# Patient Record
Sex: Female | Born: 1990
Health system: Southern US, Community
[De-identification: ages and names within clinical notes are randomized; demographics above are authoritative.]

## PROBLEM LIST (undated history)

## (undated) DIAGNOSIS — I10 Essential (primary) hypertension: Secondary | ICD-10-CM

## (undated) DIAGNOSIS — Z8639 Personal history of other endocrine, nutritional and metabolic disease: Secondary | ICD-10-CM

## (undated) DIAGNOSIS — E039 Hypothyroidism, unspecified: Secondary | ICD-10-CM

## (undated) DIAGNOSIS — A749 Chlamydial infection, unspecified: Secondary | ICD-10-CM

## (undated) DIAGNOSIS — E559 Vitamin D deficiency, unspecified: Secondary | ICD-10-CM

## (undated) DIAGNOSIS — N939 Abnormal uterine and vaginal bleeding, unspecified: Secondary | ICD-10-CM

## (undated) DIAGNOSIS — B9689 Other specified bacterial agents as the cause of diseases classified elsewhere: Secondary | ICD-10-CM

## (undated) DIAGNOSIS — Z9189 Other specified personal risk factors, not elsewhere classified: Secondary | ICD-10-CM

## (undated) DIAGNOSIS — N760 Acute vaginitis: Secondary | ICD-10-CM

## (undated) DIAGNOSIS — G4733 Obstructive sleep apnea (adult) (pediatric): Secondary | ICD-10-CM

## (undated) HISTORY — DX: Hypothyroidism, unspecified: E03.9

## (undated) HISTORY — DX: Personal history of other endocrine, nutritional and metabolic disease: Z86.39

---
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
## 2010-09-02 DIAGNOSIS — E039 Hypothyroidism, unspecified: Secondary | ICD-10-CM

## 2010-09-02 HISTORY — DX: Hypothyroidism, unspecified: E03.9

## 2013-02-19 ENCOUNTER — Emergency Department (HOSPITAL_COMMUNITY)
Admission: EM | Admit: 2013-02-19 | Discharge: 2013-02-19 | Disposition: A | Discharge status: Home / Self Care | Payer: No Typology Code available for payment source | Attending: Emergency Medicine | Admitting: Emergency Medicine

## 2013-02-19 ENCOUNTER — Encounter (HOSPITAL_COMMUNITY): Payer: Self-pay | Admitting: *Deleted

## 2013-02-19 DIAGNOSIS — R259 Unspecified abnormal involuntary movements: Secondary | ICD-10-CM | POA: Insufficient documentation

## 2013-02-19 DIAGNOSIS — Z3202 Encounter for pregnancy test, result negative: Secondary | ICD-10-CM | POA: Diagnosis not present

## 2013-02-19 DIAGNOSIS — Z8639 Personal history of other endocrine, nutritional and metabolic disease: Secondary | ICD-10-CM | POA: Insufficient documentation

## 2013-02-19 DIAGNOSIS — R5381 Other malaise: Secondary | ICD-10-CM | POA: Insufficient documentation

## 2013-02-19 DIAGNOSIS — I10 Essential (primary) hypertension: Secondary | ICD-10-CM | POA: Insufficient documentation

## 2013-02-19 DIAGNOSIS — F17200 Nicotine dependence, unspecified, uncomplicated: Secondary | ICD-10-CM | POA: Diagnosis not present

## 2013-02-19 DIAGNOSIS — R110 Nausea: Secondary | ICD-10-CM | POA: Insufficient documentation

## 2013-02-19 DIAGNOSIS — Z862 Personal history of diseases of the blood and blood-forming organs and certain disorders involving the immune mechanism: Secondary | ICD-10-CM | POA: Insufficient documentation

## 2013-02-19 HISTORY — DX: Essential (primary) hypertension: I10

## 2013-02-19 LAB — URINALYSIS, ROUTINE W REFLEX MICROSCOPIC
Glucose, UA: NEGATIVE mg/dL
Hgb urine dipstick: NEGATIVE
Leukocytes, UA: NEGATIVE
Protein, ur: NEGATIVE mg/dL
Specific Gravity, Urine: 1.025 (ref 1.005–1.030)

## 2013-02-19 LAB — TSH: TSH: 1.902 u[IU]/mL (ref 0.350–4.500)

## 2013-02-19 LAB — MONONUCLEOSIS SCREEN: Mono Screen: NEGATIVE

## 2013-02-19 LAB — CBC WITH DIFFERENTIAL/PLATELET
Basophils Absolute: 0 10*3/uL (ref 0.0–0.1)
Basophils Relative: 0 % (ref 0–1)
HCT: 37.2 % (ref 36.0–46.0)
Hemoglobin: 12.1 g/dL (ref 12.0–15.0)
Lymphocytes Relative: 31 % (ref 12–46)
MCHC: 32.5 g/dL (ref 30.0–36.0)
Monocytes Absolute: 0.8 10*3/uL (ref 0.1–1.0)
Monocytes Relative: 9 % (ref 3–12)
Neutro Abs: 4.5 10*3/uL (ref 1.7–7.7)
Neutrophils Relative %: 55 % (ref 43–77)
RDW: 13.2 % (ref 11.5–15.5)
WBC: 8.2 10*3/uL (ref 4.0–10.5)

## 2013-02-19 LAB — POCT I-STAT, CHEM 8
BUN: 9 mg/dL (ref 6–23)
Chloride: 105 mEq/L (ref 96–112)
HCT: 40 % (ref 36.0–46.0)
Potassium: 3.9 mEq/L (ref 3.5–5.1)

## 2013-02-19 LAB — PREGNANCY, URINE: Preg Test, Ur: NEGATIVE

## 2013-02-19 NOTE — ED Provider Notes (Signed)
History     CSN: [id number]  Arrival date & time 02/19/13  1617   First MD Initiated Contact with Patient 02/19/13 1723      Chief Complaint  Patient presents with  . Weakness    (Consider location/radiation/quality/duration/timing/severity/associated sxs/prior treatment) HPI Comments: Patient is a 22 year old woman who has recently moved to [place] from [place]. She says that she has felt weakness and fatigue for several days. She had a feeling of nausea and shakiness. Therefore she sought evaluation.  Patient is a 22 y.o. female presenting with weakness. The history is provided by the patient.  Weakness This is a new problem. The current episode started more than 2 days ago. The problem occurs constantly. The problem has not changed since onset.Pertinent negatives include no chest pain, no abdominal pain, no headaches and no shortness of breath. Nothing aggravates the symptoms. Nothing relieves the symptoms. She has tried nothing (She has a history of hypertension and of hypothyroidism. She's not on medication for either condition. He denies exposure to infectious mononucleosis.) for the symptoms.    Past Medical History  Diagnosis Date  . Hypertension   . Thyroid disease     History reviewed. No pertinent past surgical history.  No family history on file.  History  Substance Use Topics  . Smoking status: Current Every Day Smoker  . Smokeless tobacco: Not on file  . Alcohol Use: Yes    OB History   Grav Para Term Preterm Abortions TAB SAB Ect Mult Living                  Review of Systems  Constitutional: Positive for fatigue. Negative for fever and chills.  HENT: Negative.   Eyes: Negative.   Respiratory: Negative.  Negative for shortness of breath.   Cardiovascular: Negative for chest pain.       She has a history of hypertension.  Gastrointestinal: Negative.  Negative for abdominal pain.  Endocrine:       She has a history of hypothyroidism.  Genitourinary:        Last menstrual period was June 2. She had a normal period.  Musculoskeletal: Negative.   Skin: Negative.   Neurological: Positive for weakness. Negative for headaches.  Psychiatric/Behavioral: Negative.     Allergies  Review of patient's allergies indicates no known allergies.  Home Medications   Current Outpatient Rx  Name  Route  Sig  Dispense  Refill  . acetaminophen (TYLENOL) 500 MG tablet   Oral   Take 1,000 mg by mouth every 6 (six) hours as needed for pain.           BP 119/73  Pulse 95  Temp(Src) 99.9 F (37.7 C) (Oral)  Resp 20  SpO2 100%  LMP 01/31/2013  Physical Exam  Nursing note and vitals reviewed. Constitutional: She is oriented to person, place, and time.  Morbidly obese young woman in no distress.  HENT:  Head: Normocephalic and atraumatic.  Right Ear: External ear normal.  Left Ear: External ear normal.  Mouth/Throat: Oropharynx is clear and moist.  Eyes: Conjunctivae and EOM are normal. Pupils are equal, round, and reactive to light.  Neck: Normal range of motion. Neck supple.  No thyromegaly or mass.  Cardiovascular: Normal rate, regular rhythm and normal heart sounds.   Pulmonary/Chest: Effort normal and breath sounds normal. No respiratory distress.  Abdominal: Soft. Bowel sounds are normal.  Musculoskeletal: Normal range of motion. She exhibits no edema and no tenderness.  Neurological: She is alert  and oriented to person, place, and time.  No sensory or motor deficit.  Skin: Skin is warm and dry.  Psychiatric: She has a normal mood and affect. Her behavior is normal.    ED Course  Procedures (including critical care time)  Results for orders placed during the hospital encounter of 02/19/13  URINALYSIS, ROUTINE W REFLEX MICROSCOPIC      Result Value Range   Color, Urine YELLOW  YELLOW   APPearance CLEAR  CLEAR   Specific Gravity, Urine 1.025  1.005 - 1.030   pH 8.5 (*) 5.0 - 8.0   Glucose, UA NEGATIVE  NEGATIVE mg/dL   Hgb  urine dipstick NEGATIVE  NEGATIVE   Bilirubin Urine NEGATIVE  NEGATIVE   Ketones, ur NEGATIVE  NEGATIVE mg/dL   Protein, ur NEGATIVE  NEGATIVE mg/dL   Urobilinogen, UA 1.0  0.0 - 1.0 mg/dL   Nitrite NEGATIVE  NEGATIVE   Leukocytes, UA NEGATIVE  NEGATIVE  PREGNANCY, URINE      Result Value Range   Preg Test, Ur NEGATIVE  NEGATIVE  CBC WITH DIFFERENTIAL      Result Value Range   WBC 8.2  4.0 - 10.5 K/uL   RBC 4.65  3.87 - 5.11 MIL/uL   Hemoglobin 12.1  12.0 - 15.0 g/dL   HCT 16.1  09.6 - 04.5 %   MCV 80.0  78.0 - 100.0 fL   MCH 26.0  26.0 - 34.0 pg   MCHC 32.5  30.0 - 36.0 g/dL   RDW 40.9  81.1 - 91.4 %   Platelets 248  150 - 400 K/uL   Neutrophils Relative % 55  43 - 77 %   Neutro Abs 4.5  1.7 - 7.7 K/uL   Lymphocytes Relative 31  12 - 46 %   Lymphs Abs 2.6  0.7 - 4.0 K/uL   Monocytes Relative 9  3 - 12 %   Monocytes Absolute 0.8  0.1 - 1.0 K/uL   Eosinophils Relative 4  0 - 5 %   Eosinophils Absolute 0.4  0.0 - 0.7 K/uL   Basophils Relative 0  0 - 1 %   Basophils Absolute 0.0  0.0 - 0.1 K/uL  POCT I-STAT, CHEM 8      Result Value Range   Sodium 141  135 - 145 mEq/L   Potassium 3.9  3.5 - 5.1 mEq/L   Chloride 105  96 - 112 mEq/L   BUN 9  6 - 23 mg/dL   Creatinine, Ser 7.82  0.50 - 1.10 mg/dL   Glucose, Bld 89  70 - 99 mg/dL   Calcium, Ion 9.56  2.13 - 1.23 mmol/L   TCO2 25  0 - 100 mmol/L   Hemoglobin 13.6  12.0 - 15.0 g/dL   HCT 08.6  57.8 - 46.9 %    Patient was seen and had physical examination. Her lab tests, consisting of a CBC, basic metabolic panel, urinalysis, urine pregnancy test, were all negative. A TSH and a mono screening tests were ordered, but she will not wait for the results of these tests. I referred her to [facility] for her to establish herself as the patient.      1. Malaise and fatigue          Carleene Cooper III, MD 02/19/13 1757

## 2013-02-19 NOTE — ED Notes (Signed)
MD Davidson at bedside.

## 2013-02-19 NOTE — ED Notes (Signed)
The pt is c/o being tired and weak for 4 days.  Alert no distess

## 2013-04-15 ENCOUNTER — Emergency Department (HOSPITAL_COMMUNITY): Payer: No Typology Code available for payment source

## 2013-04-15 ENCOUNTER — Emergency Department (HOSPITAL_COMMUNITY)
Admission: EM | Admit: 2013-04-15 | Discharge: 2013-04-15 | Discharge status: Against Medical Advice | Payer: No Typology Code available for payment source | Attending: Emergency Medicine | Admitting: Emergency Medicine

## 2013-04-15 ENCOUNTER — Encounter (HOSPITAL_COMMUNITY): Payer: Self-pay | Admitting: Emergency Medicine

## 2013-04-15 ENCOUNTER — Emergency Department (HOSPITAL_COMMUNITY)
Admission: EM | Admit: 2013-04-15 | Discharge: 2013-04-15 | Disposition: A | Discharge status: Home / Self Care | Payer: No Typology Code available for payment source | Attending: Emergency Medicine | Admitting: Emergency Medicine

## 2013-04-15 DIAGNOSIS — R059 Cough, unspecified: Secondary | ICD-10-CM | POA: Insufficient documentation

## 2013-04-15 DIAGNOSIS — J329 Chronic sinusitis, unspecified: Secondary | ICD-10-CM | POA: Insufficient documentation

## 2013-04-15 DIAGNOSIS — R05 Cough: Secondary | ICD-10-CM | POA: Insufficient documentation

## 2013-04-15 DIAGNOSIS — R5381 Other malaise: Secondary | ICD-10-CM | POA: Insufficient documentation

## 2013-04-15 DIAGNOSIS — I10 Essential (primary) hypertension: Secondary | ICD-10-CM | POA: Insufficient documentation

## 2013-04-15 DIAGNOSIS — J3489 Other specified disorders of nose and nasal sinuses: Secondary | ICD-10-CM | POA: Insufficient documentation

## 2013-04-15 DIAGNOSIS — R5383 Other fatigue: Secondary | ICD-10-CM | POA: Insufficient documentation

## 2013-04-15 DIAGNOSIS — Z862 Personal history of diseases of the blood and blood-forming organs and certain disorders involving the immune mechanism: Secondary | ICD-10-CM | POA: Insufficient documentation

## 2013-04-15 DIAGNOSIS — J029 Acute pharyngitis, unspecified: Secondary | ICD-10-CM | POA: Insufficient documentation

## 2013-04-15 DIAGNOSIS — F17200 Nicotine dependence, unspecified, uncomplicated: Secondary | ICD-10-CM | POA: Insufficient documentation

## 2013-04-15 DIAGNOSIS — Z8639 Personal history of other endocrine, nutritional and metabolic disease: Secondary | ICD-10-CM | POA: Insufficient documentation

## 2013-04-15 DIAGNOSIS — R51 Headache: Secondary | ICD-10-CM | POA: Insufficient documentation

## 2013-04-15 LAB — GLUCOSE, CAPILLARY: Glucose-Capillary: 101 mg/dL — ABNORMAL HIGH (ref 70–99)

## 2013-04-15 NOTE — ED Notes (Signed)
Per pt, mother was cleaning with bleach on Monday now pt has bad cough-green mucous

## 2013-04-15 NOTE — ED Provider Notes (Signed)
CSN: [id number]     Arrival date & time 04/15/13  1830 History    This chart was scribed for a non-physician practitioner, Earley Favor, NP, working with Junius Argyle, MD by Frederik Pear, ED Scribe. This patient was seen in room WTR7/WTR7 and the patient's care was started at 2004.  First MD Initiated Contact with Patient 04/15/13 2004     Chief Complaint  Patient presents with  . Cough   (Consider location/radiation/quality/duration/timing/severity/associated sxs/prior Treatment) The history is provided by the patient and a parent. No language interpreter was used.    HPI Comments: Felicia Bailey is a 22 y.o. female who presents to the Emergency Department complaining of a productive cough with green sputum with associated rhinorrhea that began 4 days ago after cleaning with bleach. She is afebrile. LMP was 1 week, which was normal.   Pt's mother is concerned with ongoing fatigue. She was seen in the ED on 06/20 and a CBC, BMP, urinalysis, urine pregnancy test, TSH, and mono screen, which were all negative. She reports a family h/o of Hashimoto's disease and hypertension. She is also concerned with ongoing sharp pains that last for 30 seconds at a time throughout the pt's entire head with intermittent nausea that has been ongoing for several years. The pt reports she was taking propanolol, but discontinued the medication a year ago because she didn't feel as if it was helping because she does not believe the HAs are related to her hypertension. Her mother is requesting a head CT.   Her mother reports the pt recently relocated to [facility] from [place] and is not yet established with a PCP.  Past Medical History  Diagnosis Date  . Hypertension   . Thyroid disease    History reviewed. No pertinent past surgical history. No family history on file. History  Substance Use Topics  . Smoking status: Current Every Day Smoker  . Smokeless tobacco: Not on file  . Alcohol Use: Yes   OB  History   Grav Para Term Preterm Abortions TAB SAB Ect Mult Living                 Review of Systems  Constitutional: Positive for fatigue.  HENT: Positive for sore throat and rhinorrhea.   Respiratory: Positive for cough. Negative for shortness of breath.   Gastrointestinal: Negative for nausea.  Genitourinary: Negative for dysuria.  Musculoskeletal: Negative for back pain.  Skin: Negative for rash.  Neurological: Positive for headaches. Negative for dizziness, tremors, seizures, speech difficulty, weakness and numbness.  All other systems reviewed and are negative.   Allergies  Review of patient's allergies indicates no known allergies.  Home Medications   Current Outpatient Rx  Name  Route  Sig  Dispense  Refill  . acetaminophen (TYLENOL) 500 MG tablet   Oral   Take 1,000 mg by mouth every 6 (six) hours as needed for pain.          BP 138/90  Pulse 110  Temp(Src) 98.8 F (37.1 C) (Oral)  Resp 16  SpO2 96%  LMP 04/08/2013 Physical Exam  Nursing note and vitals reviewed. Constitutional: She is oriented to person, place, and time. She appears well-developed and well-nourished.  Morbidly obese  HENT:  Left Ear: External ear normal.  Nose: Nose normal.  Mouth/Throat: Oropharynx is clear and moist. No oropharyngeal exudate, posterior oropharyngeal edema, posterior oropharyngeal erythema or tonsillar abscesses.  Eyes: Pupils are equal, round, and reactive to light.  Neck: Normal range of motion. Neck  supple.  Cardiovascular: Normal rate and regular rhythm.  Exam reveals no gallop and no friction rub.   No murmur heard. Pulmonary/Chest: Breath sounds normal. No respiratory distress. She has no wheezes. She has no rales. She exhibits no tenderness.  Advantageous breath sounds.  Musculoskeletal: Normal range of motion. She exhibits no tenderness.  Lymphadenopathy:    She has no cervical adenopathy.  Neurological: She is alert and oriented to person, place, and time.   Skin: Skin is warm and dry. No rash noted. She is not diaphoretic. No erythema.  Psychiatric: She has a normal mood and affect. Her behavior is normal. Judgment and thought content normal.   ED Course   Procedures (including critical care time)  DIAGNOSTIC STUDIES: Oxygen Saturation is 96% on room air, adequate by my interpretation.    COORDINATION OF CARE:  20:15- Discussed planned course of treatment with the patient, including a chest X-ray and capillary glucose, who is agreeable at this time.  20:42 Head CT without contrast independently read by radiologist and independently reviewed by Earley Favor, NP.   Labs Reviewed  GLUCOSE, CAPILLARY - Abnormal; Notable for the following:    Glucose-Capillary 101 (*)    All other components within normal limits   Dg Chest 2 View  04/15/2013   *RADIOLOGY REPORT*  Clinical Data: Shortness of breath and cough  CHEST - 2 VIEW  Comparison: None.  Findings: Cardiomediastinal silhouette is within normal limits. The lungs are clear. No pleural effusion.  No pneumothorax.  No acute osseous abnormality.  IMPRESSION: Normal chest.   Original Report Authenticated By: Christiana Pellant, M.D.   Ct Head Wo Contrast  04/15/2013   *RADIOLOGY REPORT*  Clinical Data: Headache  CT HEAD WITHOUT CONTRAST  Technique:  Contiguous axial images were obtained from the base of the skull through the vertex without contrast.  Comparison: None.  Findings: No acute hemorrhage, acute infarction, or mass lesion is identified.  No midline shift.  No ventriculomegaly.  No skull fracture.  Dense consolidation of the right maxillary and bilateral ethmoid sinuses is noted with diffuse sinus mucoperiosteal thickening.  No skull fracture.  IMPRESSION: No acute intracranial finding.  Pansinusitis.   Original Report Authenticated By: Christiana Pellant, M.D.   No diagnosis found.  MDM  Only under duress is a chest xray and head ct being preformed as I do not think the patient needs either   Patient informed for xray and CT results  I personally performed the services described in this documentation, which was scribed in my presence. The recorded information has been reviewed and is accurate.   Arman Filter, NP 04/15/13 2116

## 2013-04-15 NOTE — ED Notes (Signed)
Pt ambulatory to exam room with steady gait. Pt with no acute distress.  

## 2013-04-15 NOTE — ED Notes (Signed)
Patient transported to X-ray 

## 2013-04-16 NOTE — ED Provider Notes (Signed)
Medical screening examination/treatment/procedure(s) were performed by non-physician practitioner and as supervising physician I was immediately available for consultation/collaboration.   Brelyn Woehl S Omayra Tulloch, MD 04/16/13 1222 

## 2013-06-26 ENCOUNTER — Encounter (HOSPITAL_COMMUNITY): Payer: Self-pay | Admitting: Emergency Medicine

## 2013-06-26 ENCOUNTER — Emergency Department (HOSPITAL_COMMUNITY)
Admission: EM | Admit: 2013-06-26 | Discharge: 2013-06-26 | Disposition: A | Discharge status: Home / Self Care | Payer: No Typology Code available for payment source | Attending: Emergency Medicine | Admitting: Emergency Medicine

## 2013-06-26 DIAGNOSIS — R229 Localized swelling, mass and lump, unspecified: Secondary | ICD-10-CM | POA: Insufficient documentation

## 2013-06-26 DIAGNOSIS — L988 Other specified disorders of the skin and subcutaneous tissue: Secondary | ICD-10-CM | POA: Insufficient documentation

## 2013-06-26 DIAGNOSIS — I10 Essential (primary) hypertension: Secondary | ICD-10-CM | POA: Insufficient documentation

## 2013-06-26 DIAGNOSIS — R21 Rash and other nonspecific skin eruption: Secondary | ICD-10-CM | POA: Insufficient documentation

## 2013-06-26 DIAGNOSIS — E039 Hypothyroidism, unspecified: Secondary | ICD-10-CM | POA: Insufficient documentation

## 2013-06-26 DIAGNOSIS — Z87891 Personal history of nicotine dependence: Secondary | ICD-10-CM | POA: Insufficient documentation

## 2013-06-26 DIAGNOSIS — L989 Disorder of the skin and subcutaneous tissue, unspecified: Secondary | ICD-10-CM

## 2013-06-26 DIAGNOSIS — E669 Obesity, unspecified: Secondary | ICD-10-CM | POA: Insufficient documentation

## 2013-06-26 LAB — URIC ACID: Uric Acid, Serum: 4.6 mg/dL (ref 2.4–7.0)

## 2013-06-26 LAB — CBC WITH DIFFERENTIAL/PLATELET
Basophils Absolute: 0 10*3/uL (ref 0.0–0.1)
Basophils Relative: 0 % (ref 0–1)
Eosinophils Absolute: 0.3 10*3/uL (ref 0.0–0.7)
Lymphocytes Relative: 32 % (ref 12–46)
MCH: 25.8 pg — ABNORMAL LOW (ref 26.0–34.0)
MCHC: 32.6 g/dL (ref 30.0–36.0)
Monocytes Absolute: 0.7 10*3/uL (ref 0.1–1.0)
Neutrophils Relative %: 56 % (ref 43–77)
Platelets: 246 10*3/uL (ref 150–400)
RDW: 13.3 % (ref 11.5–15.5)

## 2013-06-26 LAB — POCT I-STAT, CHEM 8
BUN: 4 mg/dL — ABNORMAL LOW (ref 6–23)
Calcium, Ion: 1.13 mmol/L (ref 1.12–1.23)
Hemoglobin: 12.9 g/dL (ref 12.0–15.0)
TCO2: 23 mmol/L (ref 0–100)

## 2013-06-26 LAB — C-REACTIVE PROTEIN: CRP: <0.5 mg/dL — ABNORMAL LOW (ref <0.60)

## 2013-06-26 LAB — SEDIMENTATION RATE: Sed Rate: 12 mm/hr (ref 0–22)

## 2013-06-26 MED ORDER — METHYLPREDNISOLONE (PAK) 4 MG PO TABS: ORAL_TABLET | ORAL | Status: DC

## 2013-06-26 MED ORDER — PREDNISONE 20 MG PO TABS
60.0000 mg | ORAL_TABLET | Freq: Once | ORAL | Status: AC
  Administered 2013-06-26: 60 mg via ORAL
  Filled 2013-06-26: qty 3

## 2013-06-26 MED ORDER — HYDROCODONE-ACETAMINOPHEN 5-325 MG PO TABS: 1.0000 | ORAL_TABLET | ORAL | Status: DC | PRN

## 2013-06-26 MED ORDER — HYDROCODONE-ACETAMINOPHEN 5-325 MG PO TABS
1.0000 | ORAL_TABLET | Freq: Once | ORAL | Status: AC
  Administered 2013-06-26: 1 via ORAL
  Filled 2013-06-26: qty 1

## 2013-06-26 NOTE — ED Provider Notes (Signed)
CSN: [id number]     Arrival date & time 06/26/13  0356 History   First MD Initiated Contact with Patient 06/26/13 0411     Chief Complaint  Patient presents with  . Foot Pain   HPI  History provided by the patient. Patient is a 22 year old female with past history of hypertension and thyroid disease who presents with complaints of recurrent painful nodules primarily to her lower extremities. Patient states that for the past several months since July she has had frequent episodes of painful swollen nodules under her skin of her feet and lower legs. Lesions will occur bilaterally and generally resolve after 24-48 hours with rest and sleep. She has occasionally had similar red nodules of the skin to her hands and forearm areas. Patient usually takes Tylenol or ibuprofen for the symptoms and states this helps somewhat with the pain and discomfort of the lesions in the legs. She denies any recent illnesses. Denies fever, chills or sweats. She denies any respiratory complaints. No shortness of breath or chest pains. She denies any urinary changes. No vaginal bleeding or vaginal discharge. No prior history of STDs. No other aggravating or alleviating factors. No other associated symptoms.   Past Medical History  Diagnosis Date  . Hypertension   . Thyroid disease    History reviewed. No pertinent past surgical history. History reviewed. No pertinent family history. History  Substance Use Topics  . Smoking status: Former Games developer  . Smokeless tobacco: Not on file  . Alcohol Use: 1.2 oz/week    2 Cans of beer per week   OB History   Grav Para Term Preterm Abortions TAB SAB Ect Mult Living   1 1 1             Review of Systems  Constitutional: Negative for fever, chills, diaphoresis and unexpected weight change.  Respiratory: Negative for cough and shortness of breath.   Cardiovascular: Negative for chest pain.  Gastrointestinal: Negative for nausea, vomiting, abdominal pain, diarrhea and  constipation.  Genitourinary: Negative for dysuria, frequency, hematuria, flank pain, vaginal bleeding, vaginal discharge, genital sores and menstrual problem.  Skin: Positive for rash.       Painful nodules  All other systems reviewed and are negative.    Allergies  Review of patient's allergies indicates no known allergies.  Home Medications   Current Outpatient Rx  Name  Route  Sig  Dispense  Refill  . acetaminophen (TYLENOL) 500 MG tablet   Oral   Take 1,000 mg by mouth every 6 (six) hours as needed for pain.          BP 160/96  Pulse 108  Temp(Src) 98.2 F (36.8 C) (Oral)  Resp 18  SpO2 96%  LMP 06/03/2013 Physical Exam  Nursing note and vitals reviewed. Constitutional: She is oriented to person, place, and time. She appears well-developed and well-nourished. No distress.  Obese  HENT:  Head: Normocephalic.  Eyes: Conjunctivae are normal.  Cardiovascular: Normal rate and regular rhythm.   Pulmonary/Chest: Effort normal and breath sounds normal. No respiratory distress. She has no wheezes. She has no rales.  Abdominal: Soft. There is no tenderness. There is no rebound and no guarding.  Neurological: She is alert and oriented to person, place, and time.  Skin: Skin is warm and dry.  2 erythematous and tender nodules to the bilateral plantar and medial surface to the arch of the feet. Similar smaller erythematous nodule to the dorsal right foot.  Psychiatric: She has a normal mood and  affect. Her behavior is normal.    ED Course  Procedures   Results for orders placed during the hospital encounter of 06/26/13  CBC WITH DIFFERENTIAL      Result Value Range   WBC 8.4  4.0 - 10.5 K/uL   RBC 4.80  3.87 - 5.11 MIL/uL   Hemoglobin 12.4  12.0 - 15.0 g/dL   HCT 81.1  91.4 - 78.2 %   MCV 79.2  78.0 - 100.0 fL   MCH 25.8 (*) 26.0 - 34.0 pg   MCHC 32.6  30.0 - 36.0 g/dL   RDW 95.6  21.3 - 08.6 %   Platelets 246  150 - 400 K/uL   Neutrophils Relative % 56  43 - 77 %    Lymphocytes Relative 32  12 - 46 %   Monocytes Relative 8  3 - 12 %   Eosinophils Relative 4  0 - 5 %   Basophils Relative 0  0 - 1 %   Neutro Abs 4.7  1.7 - 7.7 K/uL   Lymphs Abs 2.7  0.7 - 4.0 K/uL   Monocytes Absolute 0.7  0.1 - 1.0 K/uL   Eosinophils Absolute 0.3  0.0 - 0.7 K/uL   Basophils Absolute 0.0  0.0 - 0.1 K/uL  URIC ACID      Result Value Range   Uric Acid, Serum 4.6  2.4 - 7.0 mg/dL  POCT I-STAT, CHEM 8      Result Value Range   Sodium 139  135 - 145 mEq/L   Potassium 3.6  3.5 - 5.1 mEq/L   Chloride 110  96 - 112 mEq/L   BUN 4 (*) 6 - 23 mg/dL   Creatinine, Ser 5.78  0.50 - 1.10 mg/dL   Glucose, Bld 85  70 - 99 mg/dL   Calcium, Ion 4.69  6.29 - 1.23 mmol/L   TCO2 23  0 - 100 mmol/L   Hemoglobin 12.9  12.0 - 15.0 g/dL   HCT 52.8  41.3 - 24.4 %          MDM   1. Skin lesions, generalized   2. Hypertension     Patient is new to the area. She has been seen previously in the emergency department. Has known history of hypothyroidism. TSH levels were normal on previous visit.  Patient with episodic painful erythematous nodules primarily to the feet and lower extremities. Occasionally she has less tender nodules to the upper extremities. Denies any shortness of breath or respiratory symptoms. Her symptoms usually lasts 24-48 hours. They have been ongoing for the past 3-4 months.  Differential diagnosis include possible infection however patient afebrile with no elevated WBC, Arthritis (possibly rheumatological), gout, dermatofibroma (however she reports lesion completely resolve and return) and erythema nodosum.    Angus Seller, PA-C 06/26/13 [phone]

## 2013-06-26 NOTE — ED Provider Notes (Signed)
Medical screening examination/treatment/procedure(s) were performed by non-physician practitioner and as supervising physician I was immediately available for consultation/collaboration.    Sunnie Nielsen, MD 06/26/13 [phone]

## 2013-06-26 NOTE — ED Notes (Addendum)
Pt states that since July she has had random inflammation throughout her body, she states that she gets lumps wherever the inflammation is occuring. Pt states that she gets these lumps on the bottom of her feet and cannot walk. The only lumps that are painful are in her feet. Pt states that she gets these on her bilat feet, hands, inside of her elbows, behind her knees, calves, forearms.

## 2014-05-16 ENCOUNTER — Encounter (HOSPITAL_COMMUNITY): Payer: Self-pay | Admitting: Emergency Medicine

## 2014-05-16 DIAGNOSIS — M25476 Effusion, unspecified foot: Principal | ICD-10-CM | POA: Insufficient documentation

## 2014-05-16 DIAGNOSIS — Z8639 Personal history of other endocrine, nutritional and metabolic disease: Secondary | ICD-10-CM | POA: Insufficient documentation

## 2014-05-16 DIAGNOSIS — M25473 Effusion, unspecified ankle: Secondary | ICD-10-CM | POA: Insufficient documentation

## 2014-05-16 DIAGNOSIS — Z862 Personal history of diseases of the blood and blood-forming organs and certain disorders involving the immune mechanism: Secondary | ICD-10-CM | POA: Insufficient documentation

## 2014-05-16 DIAGNOSIS — Z87891 Personal history of nicotine dependence: Secondary | ICD-10-CM | POA: Insufficient documentation

## 2014-05-16 DIAGNOSIS — I10 Essential (primary) hypertension: Secondary | ICD-10-CM | POA: Insufficient documentation

## 2014-05-16 DIAGNOSIS — M2540 Effusion, unspecified joint: Secondary | ICD-10-CM | POA: Insufficient documentation

## 2014-05-16 DIAGNOSIS — Z791 Long term (current) use of non-steroidal anti-inflammatories (NSAID): Secondary | ICD-10-CM | POA: Insufficient documentation

## 2014-05-16 NOTE — ED Notes (Signed)
She has chronic swollen rt hand  Lt foot with redness and swelling and it moves to   othert hands and feet intermittently.  .  It is very painful.  lmp  913

## 2014-05-17 ENCOUNTER — Emergency Department (HOSPITAL_COMMUNITY)
Admission: EM | Admit: 2014-05-17 | Discharge: 2014-05-17 | Disposition: A | Discharge status: Home / Self Care | Payer: No Typology Code available for payment source | Attending: Emergency Medicine | Admitting: Emergency Medicine

## 2014-05-17 DIAGNOSIS — M2540 Effusion, unspecified joint: Secondary | ICD-10-CM

## 2014-05-17 MED ORDER — PREDNISONE 20 MG PO TABS: 60.0000 mg | ORAL_TABLET | Freq: Every day | ORAL | Status: AC

## 2014-05-17 MED ORDER — PREDNISONE 20 MG PO TABS
60.0000 mg | ORAL_TABLET | Freq: Once | ORAL | Status: AC
  Administered 2014-05-17: 60 mg via ORAL
  Filled 2014-05-17: qty 3

## 2014-05-17 MED ORDER — IBUPROFEN 800 MG PO TABS
800.0000 mg | ORAL_TABLET | Freq: Once | ORAL | Status: AC
  Administered 2014-05-17: 800 mg via ORAL
  Filled 2014-05-17: qty 1

## 2014-05-17 NOTE — ED Provider Notes (Signed)
CSN: [id number]     Arrival date & time 05/16/14  2311 History   First MD Initiated Contact with Patient 05/17/14 0123     Chief Complaint  Patient presents with  . Joint Swelling     (Consider location/radiation/quality/duration/timing/severity/associated sxs/prior Treatment) HPI Felicia Bailey is a 23 yo female with no sig pmh presenting with swelling in her feet.  She states this has occurred off and on for the past but the patient does not have a PCP to follow up with.  This is normally treated with naproxen, or she goes to the ED for treatment.  She denies an allergic ideology to her symptoms and has been told she may have gout.  At times the swelling and pain can be in her toes or hands, never anywhere else.  She denies any other systemic symptoms like fever, infections, CP, SOB, AP, or changes in her bowel or bladder.  10 Systems reviewed and are negative for acute change except as noted in the HPI.     Past Medical History  Diagnosis Date  . Hypertension   . Thyroid disease    History reviewed. No pertinent past surgical history. No family history on file. History  Substance Use Topics  . Smoking status: Former Games developer  . Smokeless tobacco: Not on file  . Alcohol Use: 1.2 oz/week    2 Cans of beer per week   OB History   Grav Para Term Preterm Abortions TAB SAB Ect Mult Living   Review of Systems    Allergies  Review of patient's allergies indicates no known allergies.  Home Medications   Prior to Admission medications   Medication Sig Start Date End Date Taking? Authorizing Provider  acetaminophen (TYLENOL) 500 MG tablet Take 1,000 mg by mouth every 6 (six) hours as needed for pain.   Yes Historical Provider, MD  ibuprofen (ADVIL,MOTRIN) 200 MG tablet Take 200 mg by mouth every 6 (six) hours as needed for pain.   Yes Historical Provider, MD  naproxen (NAPROSYN) 250 MG tablet Take 500 mg by mouth daily as needed for mild pain.   Yes Historical  Provider, MD  predniSONE (DELTASONE) 20 MG tablet Take 3 tablets (60 mg total) by mouth daily. 05/17/14 05/20/14  Tomasita Crumble, MD   BP 130/90  Pulse 80  Temp(Src) 98.7 F (37.1 C) (Oral)  Resp 18  Ht  (1.575 m)  Wt 250 lb (113.399 kg)  BMI 45.71 kg/m2  SpO2 99%  LMP 05/15/2014 Physical Exam  Nursing note and vitals reviewed. Constitutional: She is oriented to person, place, and time. She appears well-developed and well-nourished. No distress.  HENT:  Head: Normocephalic and atraumatic.  Nose: Nose normal.  Mouth/Throat: Oropharynx is clear and moist. No oropharyngeal exudate.  Eyes: Conjunctivae and EOM are normal. Pupils are equal, round, and reactive to light. No scleral icterus.  Neck: Normal range of motion. Neck supple. No JVD present. No tracheal deviation present. No thyromegaly present.  Cardiovascular: Normal rate, regular rhythm and normal heart sounds.  Exam reveals no gallop and no friction rub.   No murmur heard. Pulmonary/Chest: Effort normal and breath sounds normal. No respiratory distress. She has no wheezes. She exhibits no tenderness.  Abdominal: Soft. Bowel sounds are normal. She exhibits no distension and no mass. There is no tenderness. There is no rebound and no guarding.  Musculoskeletal: Normal range of motion. She exhibits no edema and  no tenderness.  Mildly swollen plantar surface of bilateral feet.  No warmth or drainage or erythema.   Lymphadenopathy:    She has no cervical adenopathy.  Neurological: She is alert and oriented to person, place, and time.  Skin: Skin is warm and dry. No rash noted. She is not diaphoretic. No erythema. No pallor.    ED Course  Procedures (including critical care time) Labs Review Labs Reviewed - No data to display  Imaging Review No results found.   EKG Interpretation None      MDM   Final diagnoses:  Joint swelling   Patient appears nontoxic and in no acute distress.  The swelling and pain in her feet  is annoying her. She normally gets an NSAID and steroid for pain relief and I gave her naproxen and prednisone.  On my repeat assessment she states her pain has improved.  I put her on a 5 day steroid course for continued treatment and she is in the process of getting a PCP in the area.  She was told she will need a joint tap for diagnosis of gout.  Return precautions given, vitals remain normal, safe for DC     Tomasita Crumble, MD 05/17/14 2122

## 2014-05-17 NOTE — Discharge Instructions (Signed)
Arthralgia Ms. Whiting, you were seen today for joint pain.  Continue to take prednisone as prescribed and motrin as needed for pain relief.  Follow up with a primary care doctor within 3 days for continued care.  Return for any worsening.  Thank you. Arthralgia is joint pain. A joint is a place where two bones meet. Joint pain can happen for many reasons. The joint can be bruised, stiff, infected, or weak from aging. Pain usually goes away after resting and taking medicine for soreness.  HOME CARE  Rest the joint as told by your doctor.  Keep the sore joint raised (elevated) for the first 24 hours.  Put ice on the joint area.  Put ice in a plastic bag.  Place a towel between your skin and the bag.  Leave the ice on for 15-20 minutes, 03-04 times a day.  Wear your splint, casting, elastic bandage, or sling as told by your doctor.  Only take medicine as told by your doctor. Do not take aspirin.  Use crutches as told by your doctor. Do not put weight on the joint until told to by your doctor. GET HELP RIGHT AWAY IF:   You have bruising, puffiness (swelling), or more pain.  Your fingers or toes turn blue or start to lose feeling (numb).  Your medicine does not lessen the pain.  Your pain becomes severe.  You have a temperature by mouth above 102 F (38.9 C), not controlled by medicine.  You cannot move or use the joint. MAKE SURE YOU:   Understand these instructions.  Will watch your condition.  Will get help right away if you are not doing well or get worse. Document Released: 08/07/2009 Document Revised: 11/11/2011 Document Reviewed: 08/07/2009 [facility] Patient Information 2015 [place], [place]. This information is not intended to replace advice given to you by your health care provider. Make sure you discuss any questions you have with your health care provider.

## 2014-05-17 NOTE — ED Notes (Signed)
Pt. Reports "flare ups" of joint swelling. States it is typically her feet and hands, states normally lasts approx 8 hours this episode has been almost a week. Reports severe pain with it.

## 2014-06-10 ENCOUNTER — Encounter: Payer: Self-pay | Admitting: Family Medicine

## 2014-06-10 ENCOUNTER — Ambulatory Visit: Payer: No Typology Code available for payment source | Attending: Family Medicine | Admitting: Family Medicine

## 2014-06-10 VITALS — BP 135/85 | HR 100 | Temp 99.0°F | Resp 18 | Ht 62.0 in | Wt 288.0 lb

## 2014-06-10 DIAGNOSIS — R600 Localized edema: Secondary | ICD-10-CM | POA: Insufficient documentation

## 2014-06-10 DIAGNOSIS — E039 Hypothyroidism, unspecified: Secondary | ICD-10-CM

## 2014-06-10 DIAGNOSIS — M7989 Other specified soft tissue disorders: Secondary | ICD-10-CM | POA: Insufficient documentation

## 2014-06-10 DIAGNOSIS — R609 Edema, unspecified: Secondary | ICD-10-CM

## 2014-06-10 DIAGNOSIS — Z8639 Personal history of other endocrine, nutritional and metabolic disease: Secondary | ICD-10-CM | POA: Insufficient documentation

## 2014-06-10 LAB — POCT URINALYSIS DIPSTICK
Bilirubin, UA: NEGATIVE
Blood, UA: NEGATIVE
GLUCOSE UA: NEGATIVE
KETONES UA: NEGATIVE
LEUKOCYTES UA: NEGATIVE
Nitrite, UA: NEGATIVE
Protein, UA: NEGATIVE
SPEC GRAV UA: 1.025
UROBILINOGEN UA: 0.2
pH, UA: 5.5

## 2014-06-10 NOTE — Assessment & Plan Note (Signed)
tsh today

## 2014-06-10 NOTE — Progress Notes (Signed)
   Subjective:    Patient ID: Felicia Bailey, female    DOB: 09/10/1990, 23 y.o.   MRN: [id number] CC: health concerns, random inflammation  HPI CC: foot swelling  1. Foot swelling: first noticed in July 2014. Bilateral foot swelling. With pain. Diffuse. Sometimes with itchy and rash. Rash was red macules. No medications at the onset. Last two days, resolved. Came back two weeks later. Same presentation plus now with hand swelling and lumps in forearms. In August symptoms lasted for two days. Recurred in September w/o forearm and leg lumps. Still with swelling in hand along with swelling plus pain in feet. Went to ED was given prednisone and naproxen. Prednisone helped with pain within 2 hrs. Naproxen just helps a bit with pain.   Has been given prednisone twice September 2014 and September 2015.  No fever. Sometimes itching, chills, no diffuse rash, no eyes changes, no mouth sores or vaginal sores.   No pain or swelling currently   Soc hx: non smoker  Review of Systems     Objective:   Physical Exam BP 135/85  Pulse 100  Temp(Src) 99 F (37.2 C) (Oral)  Resp 18  Ht 5\' 2"  (1.575 m)  Wt 288 lb (130.636 kg)  BMI 52.66 kg/m2  SpO2 99%  LMP 05/15/2014 Wt Readings from Last 3 Encounters:  06/10/14 288 lb (130.636 kg)  05/16/14 250 lb (113.399 kg)  General appearance: alert, cooperative, no distress and moderately obese Lungs: clear to auscultation bilaterally Heart: regular rate and rhythm, S1, S2 normal, no murmur, click, rub or gallop Extremities: extremities normal, atraumatic, no cyanosis or edema        Assessment & Plan:

## 2014-06-10 NOTE — Patient Instructions (Addendum)
[name],  Thank you for coming in today. It was a pleasure meeting you. I look forward to being your primary doctor.  You will be called with lab results.  I am most concerned about autoimmune disease and will get testing for that.   F/u in 3-4 weeks   Dr. Armen PickupFunches

## 2014-06-10 NOTE — Assessment & Plan Note (Signed)
A: intermittent, with erythematous macules/papules, ? Autoimmune disorder P: Serology testing per orders No active swelling or lesions now, no new medications at this time.

## 2014-06-10 NOTE — Progress Notes (Signed)
Establish Care;

## 2014-06-11 LAB — TSH: TSH: 1.142 u[IU]/mL (ref 0.350–4.500)

## 2014-06-11 LAB — PROTEIN / CREATININE RATIO, URINE
CREATININE, URINE: 243.2 mg/dL
Protein Creatinine Ratio: 0.07 (ref <0.15)
TOTAL PROTEIN, URINE: 16 mg/dL (ref 5–24)

## 2014-06-11 LAB — URIC ACID: Uric Acid, Serum: 5.6 mg/dL (ref 2.4–7.0)

## 2014-06-11 LAB — C-REACTIVE PROTEIN: CRP: 1.1 mg/dL — AB (ref <0.60)

## 2014-06-11 LAB — RHEUMATOID FACTOR: Rhuematoid fact SerPl-aCnc: <10 IU/mL (ref <=14)

## 2014-06-11 LAB — SEDIMENTATION RATE: Sed Rate: 17 mm/hr (ref 0–22)

## 2014-06-13 LAB — ANA: Anti Nuclear Antibody(ANA): NEGATIVE

## 2014-06-13 LAB — ANTI-DNA ANTIBODY, DOUBLE-STRANDED: ds DNA Ab: 1 IU/mL

## 2014-06-13 LAB — CYCLIC CITRUL PEPTIDE ANTIBODY, IGG: Cyclic Citrullin Peptide Ab: <2 U/mL (ref 0.0–5.0)

## 2014-06-14 ENCOUNTER — Telehealth: Payer: Self-pay | Admitting: *Deleted

## 2014-06-14 NOTE — Telephone Encounter (Signed)
Pt was given lab work results, stated has an apt for F/U        Message from: Dessa PhiFUNCHES, JOSALYN      Created: Mon Jun 13, 2014  1:47 PM        All labs normal except slightly elevated CRP which is a non specific marker of inflammation, no evidence of gout, lupus or rheumatoid arthritis

## 2014-06-14 NOTE — Telephone Encounter (Signed)
Message copied by Dyann KiefGIRALDEZ, Saleah Rishel M on Tue Jun 14, 2014  5:36 PM ------      Message from: Dessa PhiFUNCHES, JOSALYN      Created: Mon Jun 13, 2014  1:47 PM       All labs normal except slightly elevated CRP which is a non specific marker of inflammation, no evidence of gout, lupus or rheumatoid arthritis ------

## 2014-06-14 NOTE — Telephone Encounter (Signed)
Left message to return call 

## 2014-07-04 ENCOUNTER — Encounter: Payer: Self-pay | Admitting: Family Medicine

## 2014-09-12 ENCOUNTER — Encounter: Payer: Self-pay | Admitting: Family Medicine

## 2014-09-12 ENCOUNTER — Ambulatory Visit: Payer: 59 | Attending: Family Medicine | Admitting: Family Medicine

## 2014-09-12 VITALS — BP 127/84 | HR 102 | Temp 97.9°F | Resp 16 | Ht 62.0 in | Wt 289.0 lb

## 2014-09-12 DIAGNOSIS — M79671 Pain in right foot: Secondary | ICD-10-CM | POA: Insufficient documentation

## 2014-09-12 DIAGNOSIS — M791 Myalgia, unspecified site: Secondary | ICD-10-CM | POA: Insufficient documentation

## 2014-09-12 DIAGNOSIS — M79672 Pain in left foot: Secondary | ICD-10-CM | POA: Insufficient documentation

## 2014-09-12 DIAGNOSIS — Z6841 Body Mass Index (BMI) 40.0 and over, adult: Secondary | ICD-10-CM

## 2014-09-12 DIAGNOSIS — M79659 Pain in unspecified thigh: Secondary | ICD-10-CM | POA: Insufficient documentation

## 2014-09-12 LAB — CBC WITH DIFFERENTIAL/PLATELET
BASOS ABS: 0 10*3/uL (ref 0.0–0.1)
BASOS PCT: 0 % (ref 0–1)
Eosinophils Absolute: 0.4 10*3/uL (ref 0.0–0.7)
Eosinophils Relative: 5 % (ref 0–5)
HCT: 38.1 % (ref 36.0–46.0)
Hemoglobin: 11.9 g/dL — ABNORMAL LOW (ref 12.0–15.0)
Lymphocytes Relative: 28 % (ref 12–46)
Lymphs Abs: 2.1 10*3/uL (ref 0.7–4.0)
MCH: 24.8 pg — ABNORMAL LOW (ref 26.0–34.0)
MCHC: 31.2 g/dL (ref 30.0–36.0)
MCV: 79.5 fL (ref 78.0–100.0)
MONO ABS: 0.7 10*3/uL (ref 0.1–1.0)
MPV: 11.2 fL (ref 8.6–12.4)
Monocytes Relative: 9 % (ref 3–12)
Neutro Abs: 4.3 10*3/uL (ref 1.7–7.7)
Neutrophils Relative %: 58 % (ref 43–77)
Platelets: 265 10*3/uL (ref 150–400)
RBC: 4.79 MIL/uL (ref 3.87–5.11)
RDW: 13.9 % (ref 11.5–15.5)
WBC: 7.4 10*3/uL (ref 4.0–10.5)

## 2014-09-12 LAB — CK: CK TOTAL: 124 U/L (ref 7–177)

## 2014-09-12 NOTE — Progress Notes (Signed)
Pt is here following up on her HTN. Pt had labs drawn and would like to review her results. Pt has no C.C. Today.

## 2014-09-12 NOTE — Progress Notes (Signed)
   Subjective:    Patient ID: Marcella Dubseidre Passage, female    DOB: 09/10/1990, 24 y.o.   MRN: [id number] CC: f/u intermittent swelling and muscle aches  HPI 24 yo F presents for f/u:  1. Intermittent swelling: persist. Improved with prednisone. Associated sometimes with erythematous macules but not always. Gets occasional achy thigh pain.   2. Foot pain: comes and goes, mostly plantar aspect. Improved with prednisone. No trauma.   Soc Hx: non smoker  Review of Systems As per HPI     Objective:   Physical Exam BP 127/84 mmHg  Pulse 102  Temp(Src) 97.9 F (36.6 C) (Oral)  Resp 16  Ht 5\' 2"  (1.575 m)  Wt 289 lb (131.09 kg)  BMI 52.85 kg/m2  SpO2 99%  LMP 09/09/2014  Wt Readings from Last 3 Encounters:  09/12/14 289 lb (131.09 kg)  06/10/14 288 lb (130.636 kg)  05/16/14 250 lb (113.399 kg)  General appearance: alert, cooperative and no distress Lungs: normal WOB Extremities: no significant edema, no erythema, no joint deformity        Assessment & Plan:

## 2014-09-12 NOTE — Patient Instructions (Signed)
[name],  Thank you for coming in today.  1. Labs: all normal.  2. Foot pain:  Please go for x-rays Referral to podiatry for possible plantar fascitis.  3. Muscle aches and intermittent rash. We will still rule out autoimmune disease with additional labs.   F/u in 2 months  F/u in Dr. Armen Bailey

## 2014-09-12 NOTE — Assessment & Plan Note (Signed)
A:Foot pain.  P: Please go for x-rays Referral to podiatry for possible plantar fascitis.

## 2014-09-12 NOTE — Assessment & Plan Note (Signed)
A: normal serology for autoimmune disease and no evidence of infection. Suspect deconditioning mostly P: CBC with diff, CK and myoglobin to r/o autoimmune myositis

## 2014-09-15 LAB — MYOGLOBIN, SERUM: Myoglobin: 31 mcg/L — ABNORMAL HIGH (ref <=30)

## 2014-09-20 ENCOUNTER — Telehealth: Payer: Self-pay | Admitting: *Deleted

## 2014-09-20 NOTE — Telephone Encounter (Signed)
Left voice message with normal labs If any question return call 

## 2014-09-20 NOTE — Telephone Encounter (Signed)
-----   Message from Lora PaulaJosalyn C Funches, MD sent at 09/15/2014  4:57 PM EST ----- Normal CK, slight (not significant) elevation in myoglobin.  Normal CBC with slight anemia

## 2014-10-04 IMAGING — CT CT HEAD W/O CM
2 series · 17 of 30 positions shown, 20 images · non-contrast
Comparison: None.

CLINICAL DATA: Headache

CT HEAD WITHOUT CONTRAST
TECHNIQUE: Contiguous axial images were obtained from the base of
the skull through the vertex without contrast.

[Series 2: head w/o · axial · non-contrast · 0.45mm/px · z∈[-185,-75]mm · 9 of 28 slices shown, 12 images]
[im 3/28  brain]
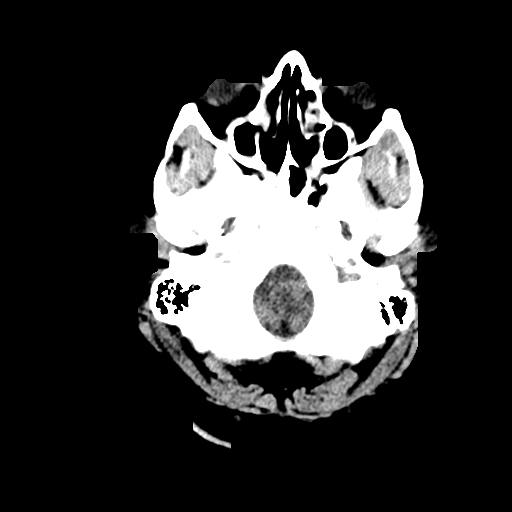
[im 3/28  bone]
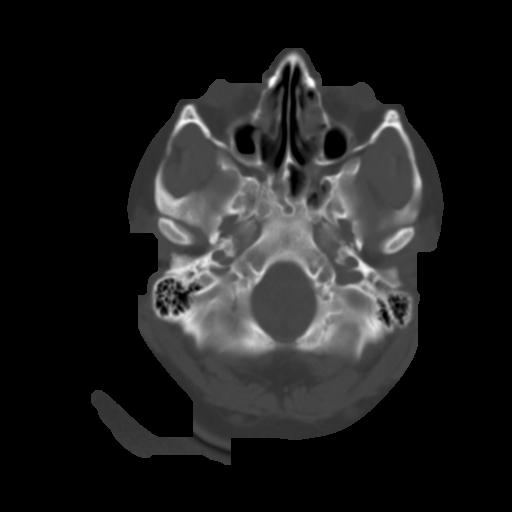
[im 6/28  brain]
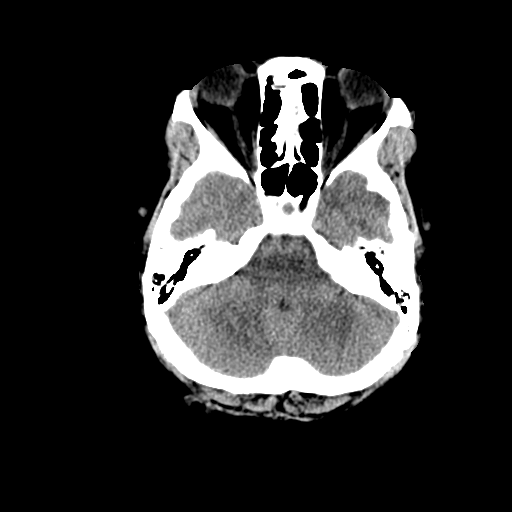
[im 9/28  brain]
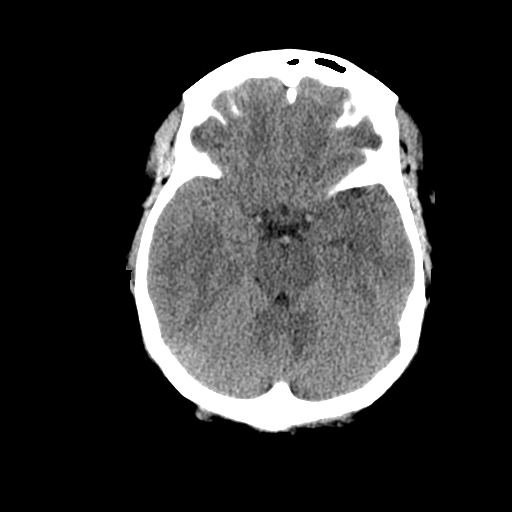
[im 11/28  brain]
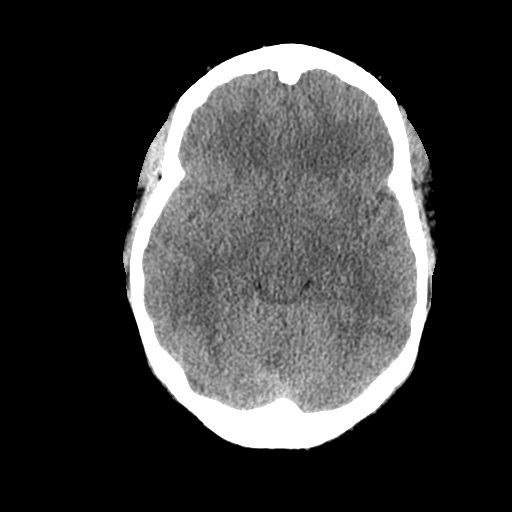
[im 14/28  brain]
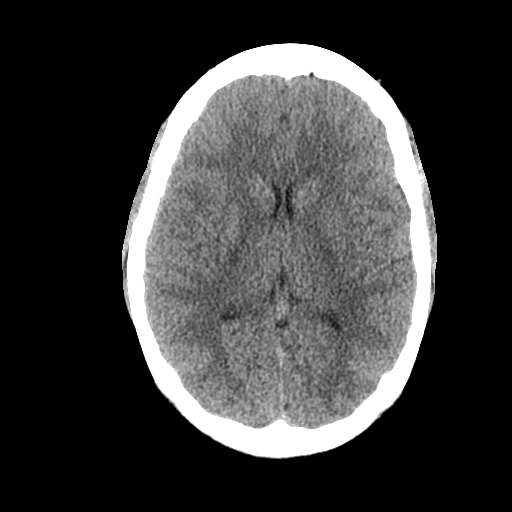
[im 14/28  bone]
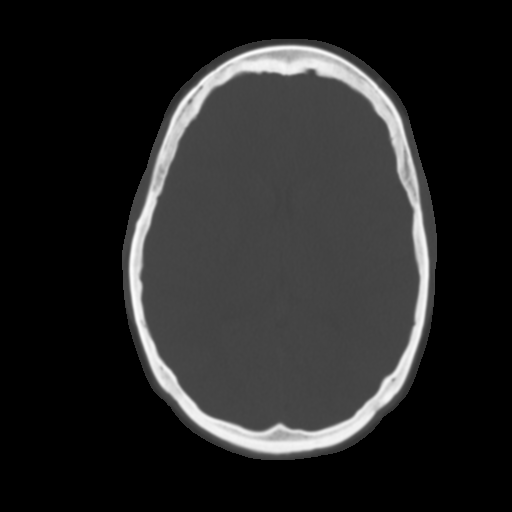
[im 17/28  brain]
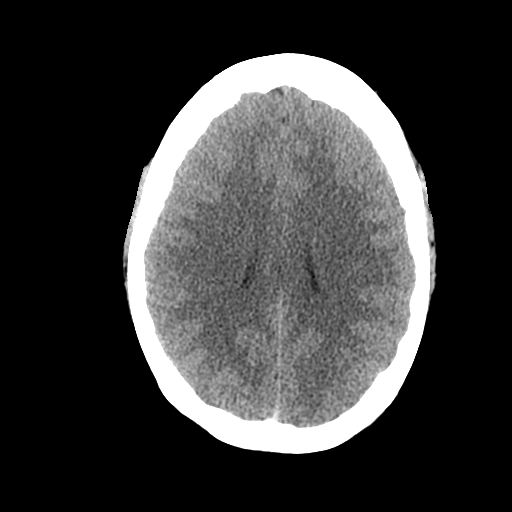
[im 19/28  brain]
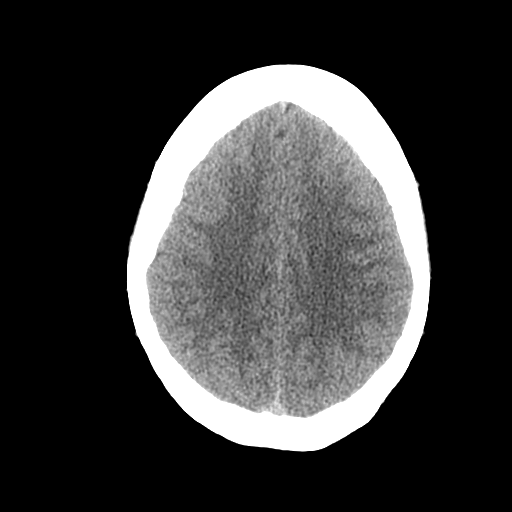
[im 22/28  brain]
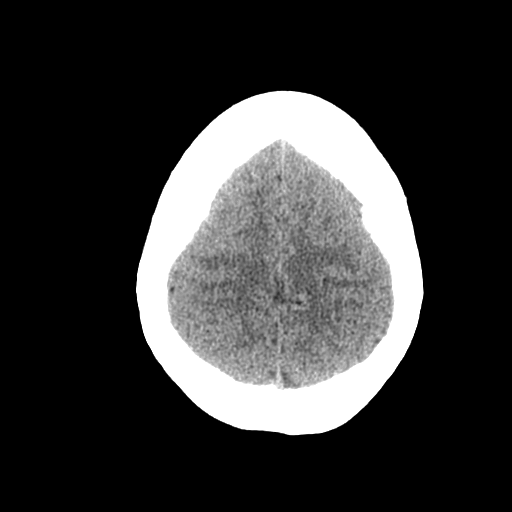
[im 25/28  brain]
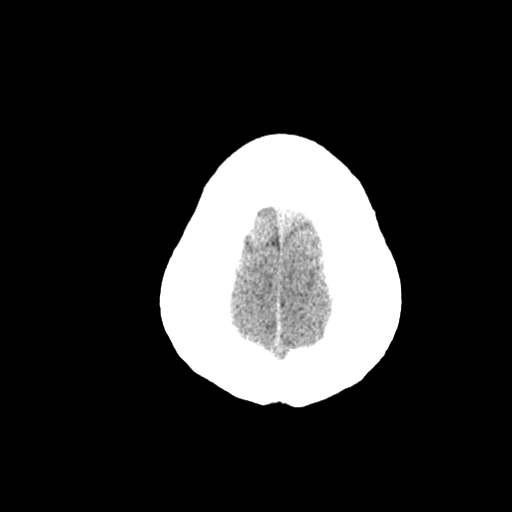
[im 25/28  bone]
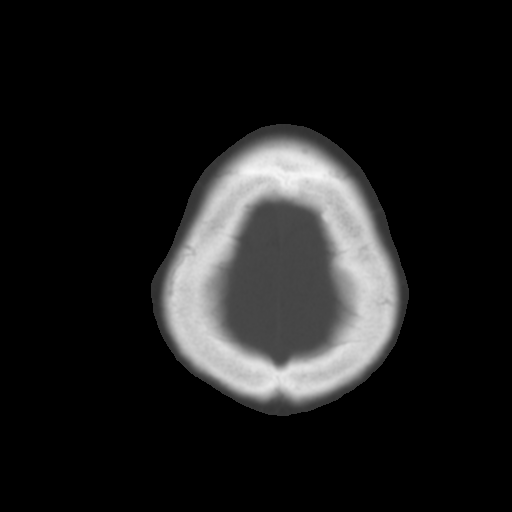

[Series 3: bone windows · axial · 0.45mm/px · z∈[-180,-75]mm · 8 of 46 slices shown]
[im 6/46  bone]
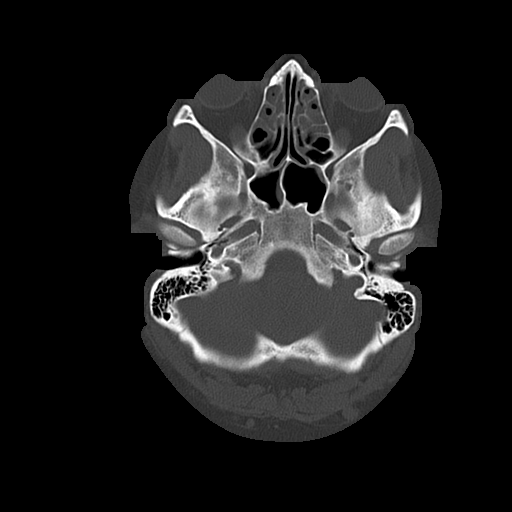
[im 11/46  bone]
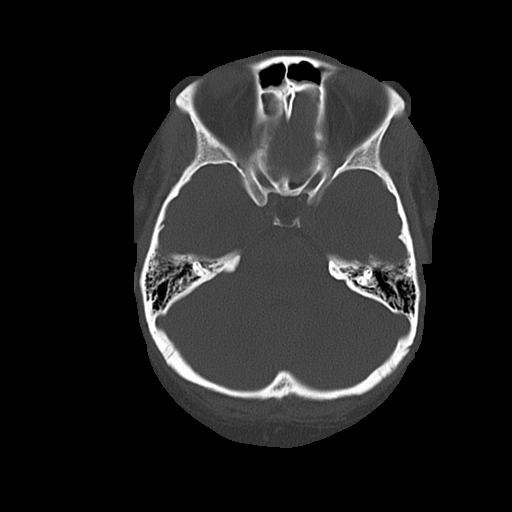
[im 16/46  bone]
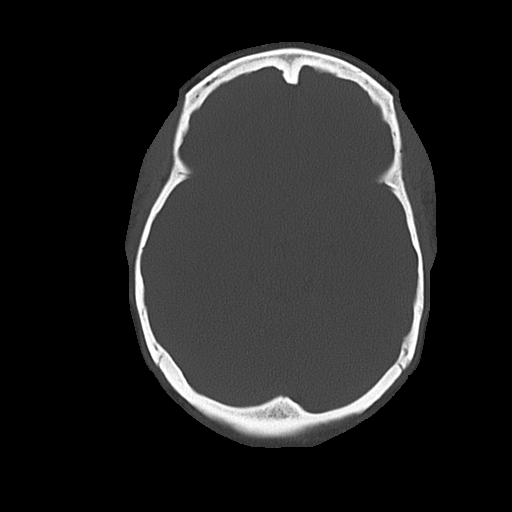
[im 21/46  bone]
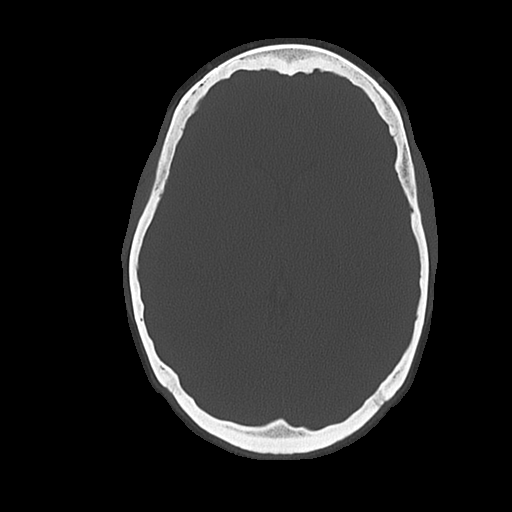
[im 26/46  bone]
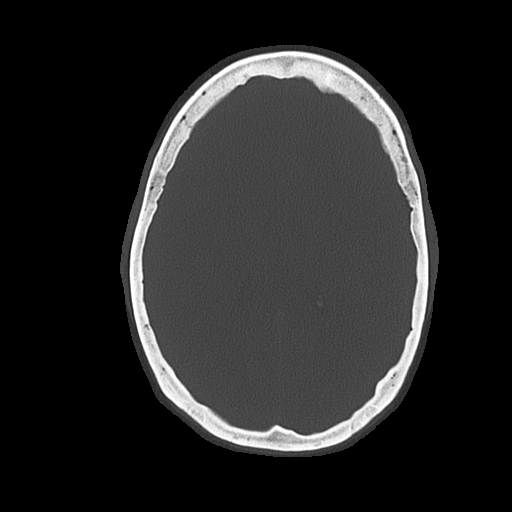
[im 31/46  bone]
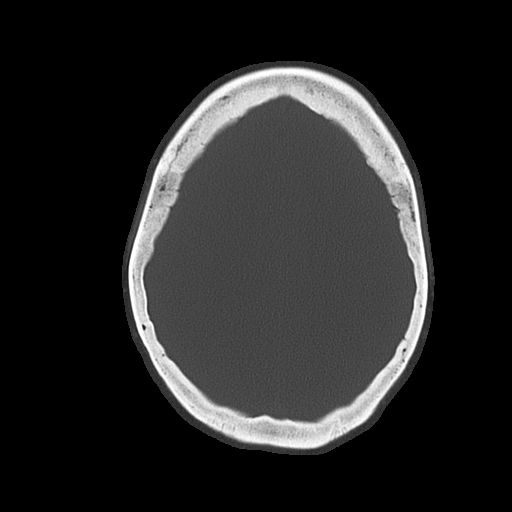
[im 36/46  bone]
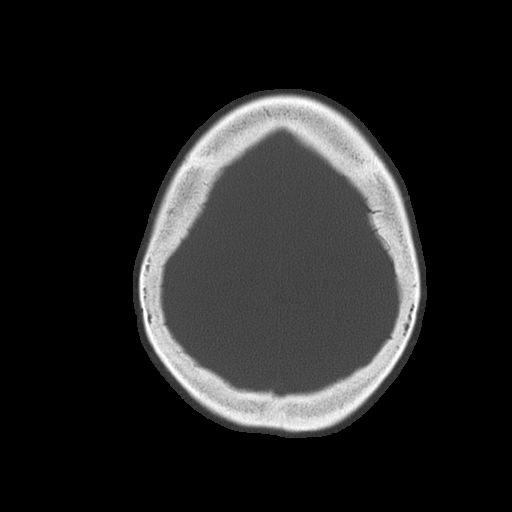
[im 41/46  bone]
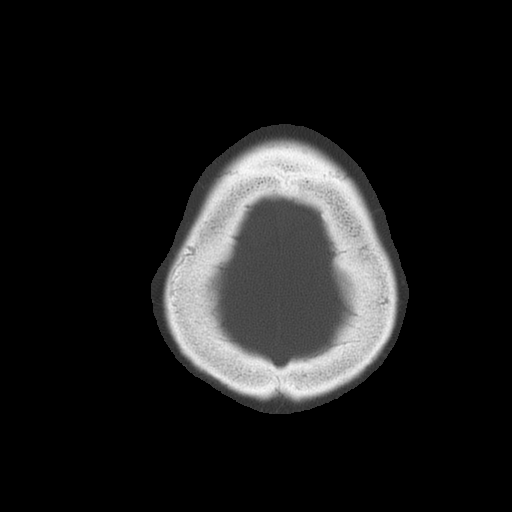

[17 of 30 positions shown; findings below may reference images not displayed]

FINDINGS: No acute hemorrhage, acute infarction, or mass lesion is
identified.  No midline shift.  No ventriculomegaly.  No skull
fracture.  Dense consolidation of the right maxillary and bilateral
ethmoid sinuses is noted with diffuse sinus mucoperiosteal
thickening.  No skull fracture.
IMPRESSION: No acute intracranial finding.  Pansinusitis.

## 2014-10-04 IMAGING — CR DG CHEST 2V
2 series · 2 of 2 positions shown · non-contrast
Comparison: None.

CLINICAL DATA: Shortness of breath and cough

CHEST - 2 VIEW

[w chest pa]
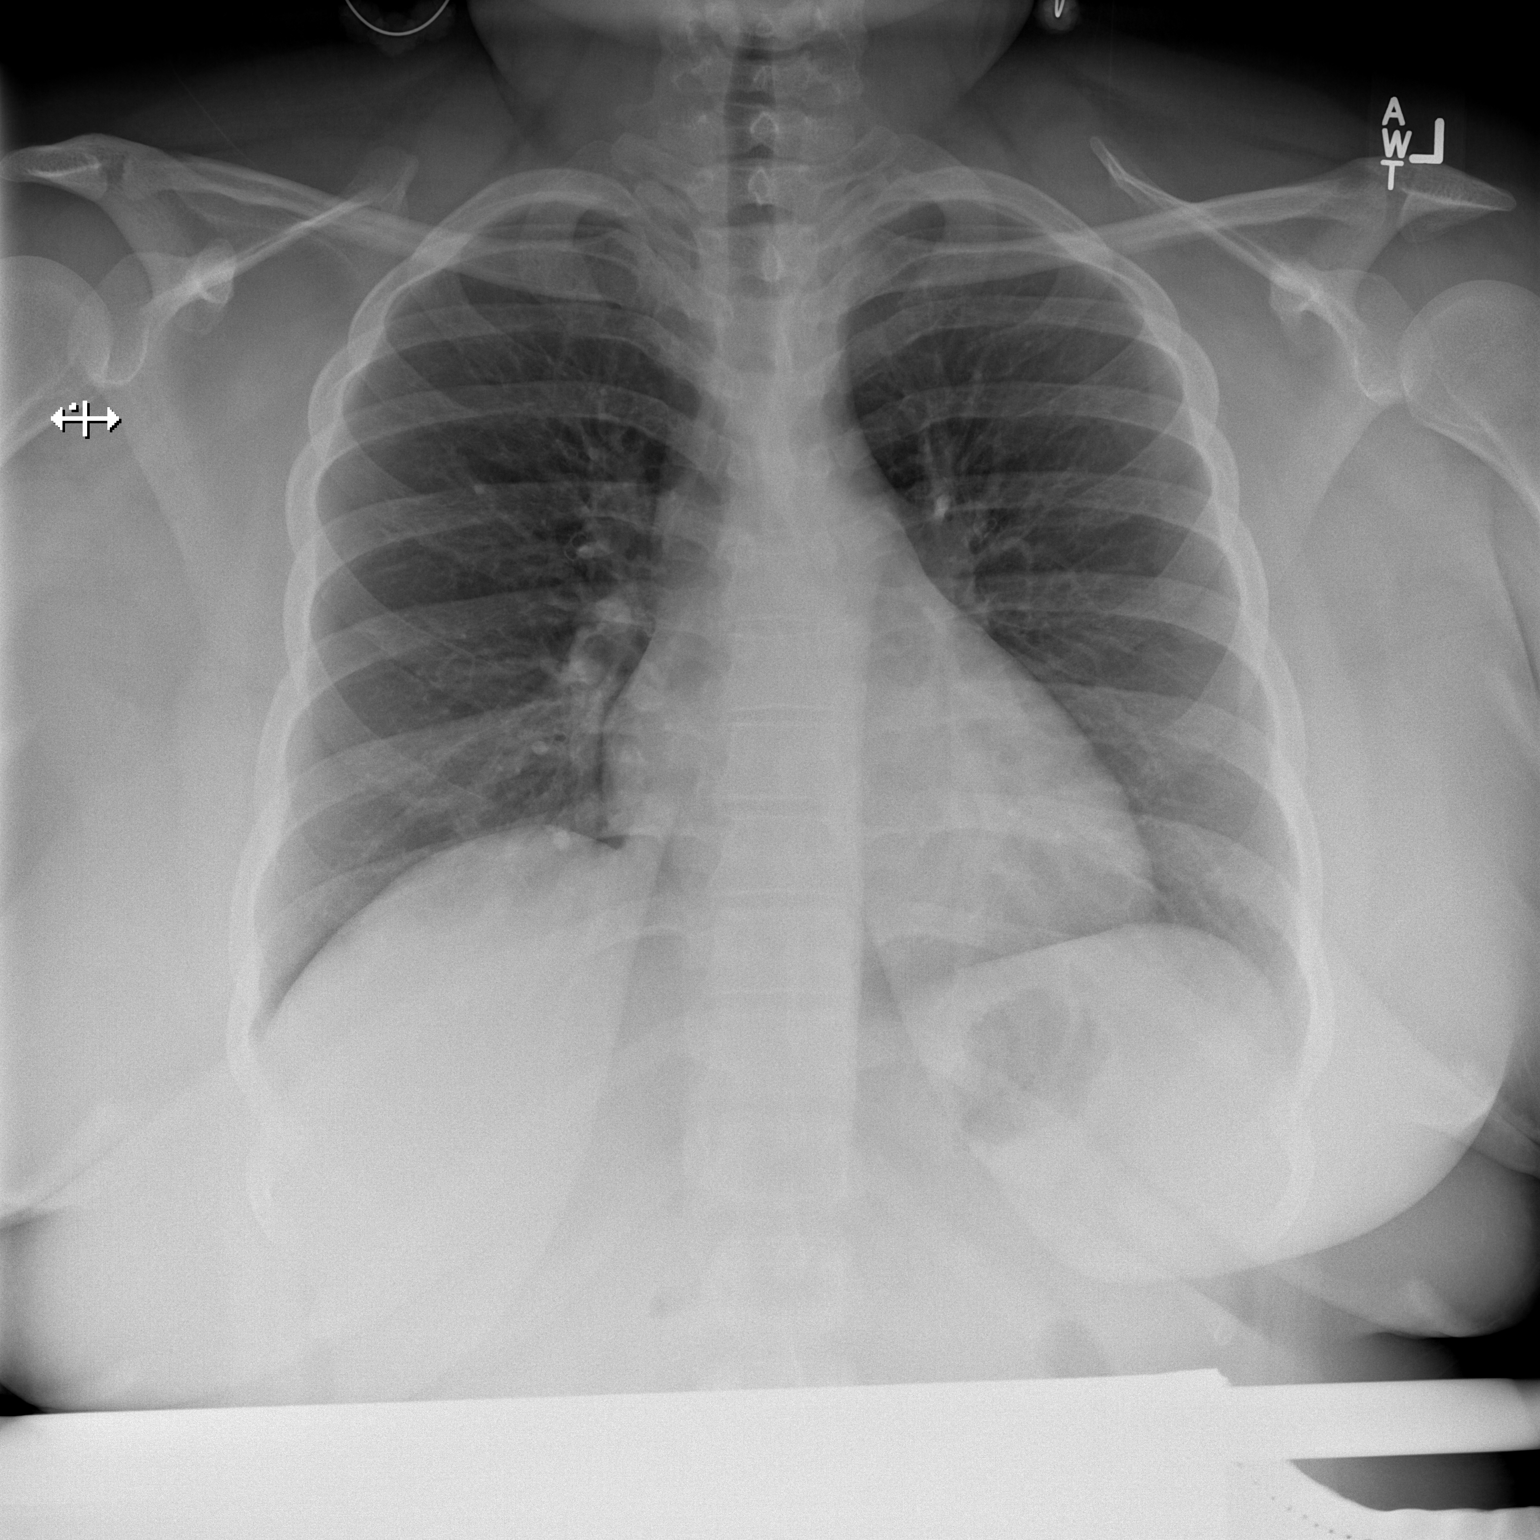

[w chest lat]
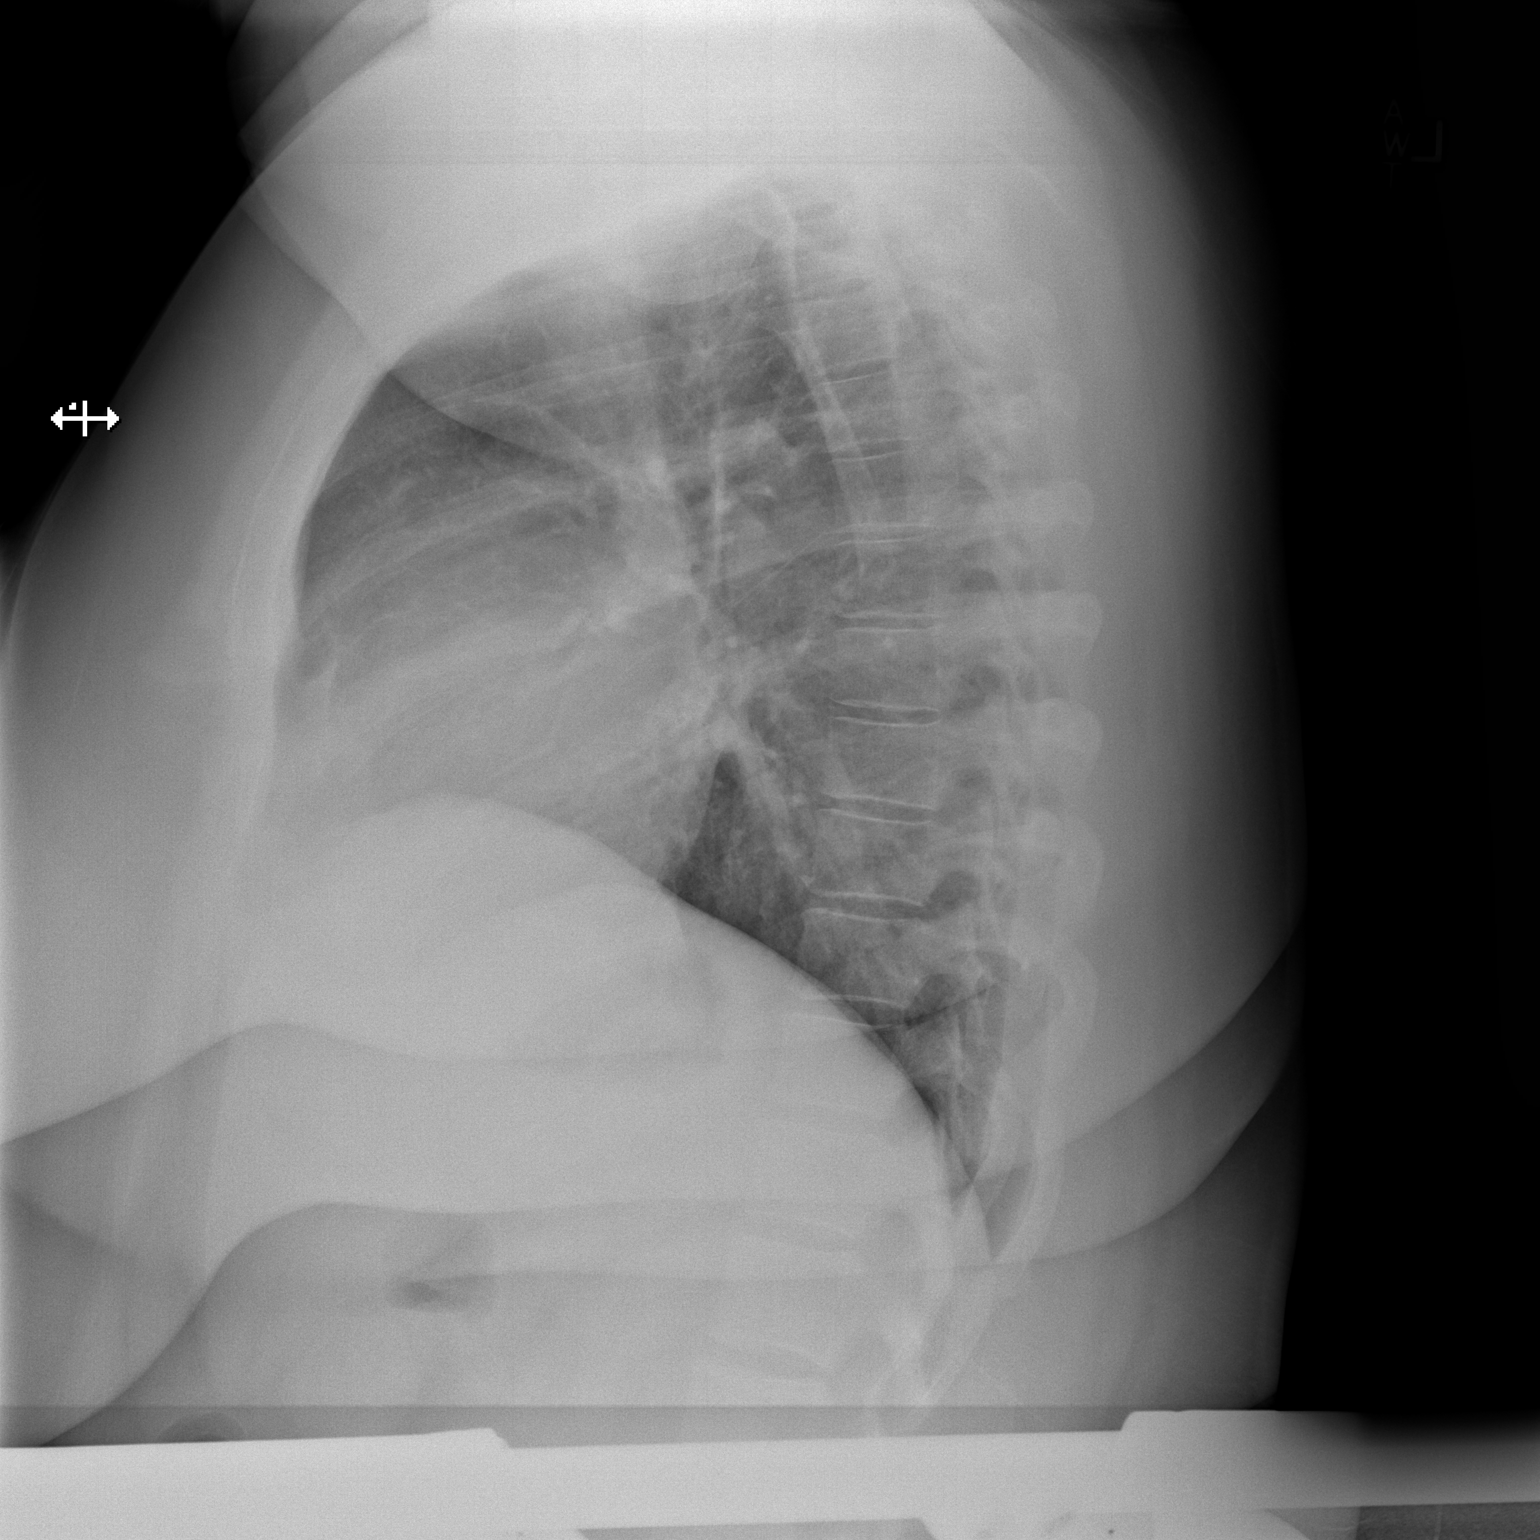

[2 of 2 positions shown; findings below may reference images not displayed]

FINDINGS: Cardiomediastinal silhouette is within normal limits. The
lungs are clear. No pleural effusion.  No pneumothorax.  No acute
osseous abnormality.
IMPRESSION: Normal chest.

## 2014-10-05 ENCOUNTER — Ambulatory Visit: Payer: No Typology Code available for payment source | Admitting: Podiatry

## 2014-12-26 ENCOUNTER — Encounter: Payer: Self-pay | Admitting: Family Medicine

## 2014-12-26 ENCOUNTER — Ambulatory Visit: Payer: 59 | Attending: Family Medicine | Admitting: Family Medicine

## 2014-12-26 VITALS — BP 130/92 | HR 96 | Temp 99.0°F | Resp 20 | Ht 62.0 in | Wt 304.0 lb

## 2014-12-26 DIAGNOSIS — M79672 Pain in left foot: Secondary | ICD-10-CM | POA: Insufficient documentation

## 2014-12-26 DIAGNOSIS — I10 Essential (primary) hypertension: Secondary | ICD-10-CM | POA: Insufficient documentation

## 2014-12-26 DIAGNOSIS — R030 Elevated blood-pressure reading, without diagnosis of hypertension: Secondary | ICD-10-CM

## 2014-12-26 DIAGNOSIS — M7989 Other specified soft tissue disorders: Secondary | ICD-10-CM | POA: Insufficient documentation

## 2014-12-26 DIAGNOSIS — R609 Edema, unspecified: Secondary | ICD-10-CM

## 2014-12-26 DIAGNOSIS — M79671 Pain in right foot: Secondary | ICD-10-CM | POA: Insufficient documentation

## 2014-12-26 DIAGNOSIS — IMO0001 Reserved for inherently not codable concepts without codable children: Secondary | ICD-10-CM

## 2014-12-26 MED ORDER — CETIRIZINE HCL 10 MG PO TABS: 10.0000 mg | ORAL_TABLET | Freq: Every day | ORAL | Status: AC

## 2014-12-26 MED ORDER — PREDNISONE 20 MG PO TABS: 20.0000 mg | ORAL_TABLET | Freq: Every day | ORAL | Status: DC

## 2014-12-26 MED ORDER — DICLOFENAC SODIUM 75 MG PO TBEC: 75.0000 mg | DELAYED_RELEASE_TABLET | Freq: Two times a day (BID) | ORAL | Status: DC

## 2014-12-26 NOTE — Patient Instructions (Signed)
[name],  Thank you for coming back in to see me today.  1. Foot pain:  Weight gain and plantar fascitis is most likely However, given other associated symptoms: swelling, skin rash, itching, I have placed a referral to rheumatology Please go to podiatry once able.  Elevated feet Ice if needed Diclofenac with food for moderate pain Prednisone for severe pain   2. Swelling with skin changes: Elevated BP, weight gain likely causes Keep in mind the prednisone can worsen edema  Please keep a low salt diet Focus on weight loss with decreasing calories a bit, substituting higher calorie foods for lower calories foods, being as active as possible.  Elevate legs  F/u in 3 weeks with RN for BP check  F/u with me in 2 months for leg swelling and foot pain  Dr. Armen PickupFunches

## 2014-12-26 NOTE — Progress Notes (Signed)
   Subjective:    Patient ID: Felicia Bailey, female    DOB: 05/26/1991, 24 y.o.   MRN: [id number] CC: foot pain, intermittent swelling, intermittent red splotches   HPI  1. Foot pain: persistent plantar foot pain. Patient has not been to podiatry. No improvement with ibuprofen. Prednisone is it the only thing that helps. Negative inflammatory marker except for slightly elevated CRP. Patient is gaining weight.    2. Swelling: intermittent b/l LE swelling with red spots on skin and itching. Comes and goes over the past 2 years.  Last happened 3 days ago. Lasted x 2 days. Stopped yesterday.   3. Elevated BP: BP elevated today. Has had elevated BP on procardia when she was pregnant with her son. Did not develop chronic HTN. Gaining weight. Eats with seasoned salt.   Soc Hx: non smoker  Review of Systems  Eyes: Negative for visual disturbance.  Musculoskeletal: Positive for myalgias, joint swelling and arthralgias.  Skin: Negative for rash and wound.      Objective:   Physical Exam BP 147/85 mmHg  Pulse 96  Temp(Src) 99 F (37.2 C) (Oral)  Resp 20  Ht 5\' 2"  (1.575 m)  Wt 304 lb (137.893 kg)  BMI 55.59 kg/m2  SpO2 99%  LMP 12/18/2014  Wt Readings from Last 3 Encounters:  12/26/14 304 lb (137.893 kg)  09/12/14 289 lb (131.09 kg)  06/10/14 288 lb (130.636 kg)   BP Readings from Last 3 Encounters:  12/26/14 147/85  09/12/14 127/84  06/10/14 135/85   General appearance: alert, cooperative, no distress and morbidly obese  Eyes: normal conjunctiva  Skin: small non tender nodules on back of neck and R side of neck also with cluster of hyperpigmented papules on R metacarpal area also non tender        Assessment & Plan:

## 2014-12-26 NOTE — Assessment & Plan Note (Signed)
A; persistent. Patient has not gone to podiatry. Requesting prednisone refill P: Diclofenac, elevation, low salt diet (including season salt) for moderate pain If pain becomes severe prednisone 20-40 mg  for short course 3-5 days

## 2014-12-26 NOTE — Assessment & Plan Note (Signed)
.   Swelling with skin changes: Elevated BP, weight gain likely causes Keep in mind the prednisone can worsen edema  Please keep a low salt diet Focus on weight loss with decreasing calories a bit, substituting higher calorie foods for lower calories foods, being as active as possible.  Elevate legs

## 2014-12-26 NOTE — Assessment & Plan Note (Signed)
A; elevated today. Normal last visit. With swelling. Patient does have a hx of HTN P: Low salt diet Weight loss Close f/u in 4 weeks Start chlorthalidone 25 mg daily if elevated >140/90 at next visit

## 2014-12-26 NOTE — Progress Notes (Signed)
F/U feet pain. Stated still with chills and bump on extremities  No changes since last visit

## 2015-12-25 ENCOUNTER — Encounter: Payer: Self-pay | Admitting: Family Medicine

## 2015-12-25 ENCOUNTER — Ambulatory Visit: Payer: Self-pay | Attending: Family Medicine | Admitting: Family Medicine

## 2015-12-25 VITALS — BP 140/83 | HR 114 | Temp 97.6°F | Resp 16 | Ht 62.0 in | Wt 334.0 lb

## 2015-12-25 DIAGNOSIS — Z114 Encounter for screening for human immunodeficiency virus [HIV]: Secondary | ICD-10-CM

## 2015-12-25 DIAGNOSIS — N926 Irregular menstruation, unspecified: Secondary | ICD-10-CM

## 2015-12-25 DIAGNOSIS — R21 Rash and other nonspecific skin eruption: Secondary | ICD-10-CM | POA: Insufficient documentation

## 2015-12-25 DIAGNOSIS — M79672 Pain in left foot: Secondary | ICD-10-CM | POA: Insufficient documentation

## 2015-12-25 DIAGNOSIS — L659 Nonscarring hair loss, unspecified: Secondary | ICD-10-CM

## 2015-12-25 DIAGNOSIS — Z79899 Other long term (current) drug therapy: Secondary | ICD-10-CM | POA: Insufficient documentation

## 2015-12-25 DIAGNOSIS — R030 Elevated blood-pressure reading, without diagnosis of hypertension: Secondary | ICD-10-CM | POA: Insufficient documentation

## 2015-12-25 DIAGNOSIS — I10 Essential (primary) hypertension: Secondary | ICD-10-CM

## 2015-12-25 DIAGNOSIS — M2550 Pain in unspecified joint: Secondary | ICD-10-CM | POA: Insufficient documentation

## 2015-12-25 DIAGNOSIS — M79671 Pain in right foot: Secondary | ICD-10-CM

## 2015-12-25 LAB — CBC
HCT: 35.7 % (ref 35.0–45.0)
Hemoglobin: 11 g/dL — ABNORMAL LOW (ref 11.7–15.5)
MCH: 23.9 pg — AB (ref 27.0–33.0)
MCHC: 30.8 g/dL — AB (ref 32.0–36.0)
MCV: 77.4 fL — AB (ref 80.0–100.0)
MPV: 10.3 fL (ref 7.5–12.5)
PLATELETS: 269 10*3/uL (ref 140–400)
RBC: 4.61 MIL/uL (ref 3.80–5.10)
RDW: 14.2 % (ref 11.0–15.0)
WBC: 8.1 10*3/uL (ref 3.8–10.8)

## 2015-12-25 MED ORDER — PREDNISONE 20 MG PO TABS: 20.0000 mg | ORAL_TABLET | Freq: Every day | ORAL | Status: DC

## 2015-12-25 MED ORDER — CHLORTHALIDONE 25 MG PO TABS: 25.0000 mg | ORAL_TABLET | Freq: Every day | ORAL | Status: DC

## 2015-12-25 MED ORDER — CHLORTHALIDONE 25 MG PO TABS: 25.0000 mg | ORAL_TABLET | Freq: Every day | ORAL | Status: AC

## 2015-12-25 MED ORDER — PREDNISONE 20 MG PO TABS: 20.0000 mg | ORAL_TABLET | Freq: Every day | ORAL | Status: AC

## 2015-12-25 NOTE — Patient Instructions (Addendum)
Felicia Bailey was seen today for hypertension.  Diagnoses and all orders for this visit:  Essential hypertension -     COMPLETE METABOLIC PANEL WITH GFR -     Discontinue: chlorthalidone (HYGROTON) 25 MG tablet; Take 1 tablet (25 mg total) by mouth daily. -     TSH -     chlorthalidone (HYGROTON) 25 MG tablet; Take 1 tablet (25 mg total) by mouth daily.  Hair loss -     Testosterone -     TSH  Foot pain, bilateral -     Discontinue: predniSONE (DELTASONE) 20 MG tablet; Take 1-2 tablets (20-40 mg total) by mouth daily with breakfast. -     predniSONE (DELTASONE) 20 MG tablet; Take 1-2 tablets (20-40 mg total) by mouth daily with breakfast.  Irregular menstrual cycle -     CBC  Screening for HIV (human immunodeficiency virus) -     HIV antibody (with reflex)    F/u in 3-4 weeks for pap, and BP check and breathing check   Dr. Armen PickupFunches   Food Choices for Gastroesophageal Reflux Disease, Adult When you have gastroesophageal reflux disease (GERD), the foods you eat and your eating habits are very important. Choosing the right foods can help ease the discomfort of GERD. WHAT GENERAL GUIDELINES DO I NEED TO FOLLOW?  Choose fruits, vegetables, whole grains, low-fat dairy products, and low-fat meat, fish, and poultry.  Limit fats such as oils, salad dressings, butter, nuts, and avocado.  Keep a food diary to identify foods that cause symptoms.  Avoid foods that cause reflux. These may be different for different people.  Eat frequent small meals instead of three large meals each day.  Eat your meals slowly, in a relaxed setting.  Limit fried foods.  Cook foods using methods other than frying.  Avoid drinking alcohol.  Avoid drinking large amounts of liquids with your meals.  Avoid bending over or lying down until 2-3 hours after eating. WHAT FOODS ARE NOT RECOMMENDED? The following are some foods and drinks that may worsen your symptoms: Vegetables Tomatoes. Tomato juice.  Tomato and spaghetti sauce. Chili peppers. Onion and garlic. Horseradish. Fruits Oranges, grapefruit, and lemon (fruit and juice). Meats High-fat meats, fish, and poultry. This includes hot dogs, ribs, ham, sausage, salami, and bacon. Dairy Whole milk and chocolate milk. Sour cream. Cream. Butter. Ice cream. Cream cheese.  Beverages Coffee and tea, with or without caffeine. Carbonated beverages or energy drinks. Condiments Hot sauce. Barbecue sauce.  Sweets/Desserts Chocolate and cocoa. Donuts. Peppermint and spearmint. Fats and Oils High-fat foods, including [place] fries and potato chips. Other Vinegar. Strong spices, such as black pepper, white pepper, red pepper, cayenne, curry powder, cloves, ginger, and chili powder. The items listed above may not be a complete list of foods and beverages to avoid. Contact your dietitian for more information.   This information is not intended to replace advice given to you by your health care provider. Make sure you discuss any questions you have with your health care provider.   Document Released: 08/19/2005 Document Revised: 09/09/2014 Document Reviewed: 06/23/2013 Elsevier Interactive Patient Education [employer] Inc[employer]

## 2015-12-25 NOTE — Progress Notes (Signed)
F/U HTN  Loss of hair, eye brow and head  Lumps on arms  Pain scale #0 Tobacco user  No suicidal though in the past two weeks

## 2015-12-25 NOTE — Progress Notes (Signed)
Subjective:  Patient ID: Felicia Bailey, female    DOB: 10/19/1990  Age: 25 y.o. MRN: [id number]  CC: Hypertension   HPI Felicia Bailey presents for   1. Elevated BP: comes and goes. Has intermittent swelling in feet. Gaining weight.  2. Joint pain: chronic. Has rash and erythematous nodules at times. Has not gone to rheumatology. Requesting refill of prednisone as it helps with foots pains when they swell.   Social History  Substance Use Topics  . Smoking status: Never Smoker   . Smokeless tobacco: Never Used  . Alcohol Use: 4.2 oz/week    7 Cans of beer per week     Comment: beer per day     Outpatient Prescriptions Prior to Visit  Medication Sig Dispense Refill  . acetaminophen (TYLENOL) 500 MG tablet Take 1,000 mg by mouth every 6 (six) hours as needed for pain.    . cetirizine (ZYRTEC) 10 MG tablet Take 1 tablet (10 mg total) by mouth daily. 30 tablet 11  . diclofenac (VOLTAREN) 75 MG EC tablet Take 1 tablet (75 mg total) by mouth 2 (two) times daily. 60 tablet 1  . predniSONE (DELTASONE) 20 MG tablet Take 1-2 tablets (20-40 mg total) by mouth daily with breakfast. 10 tablet 1   No facility-administered medications prior to visit.    ROS Review of Systems  Constitutional: Negative for fever and chills.  Eyes: Negative for visual disturbance.  Respiratory: Negative for shortness of breath.   Cardiovascular: Positive for chest pain and leg swelling.  Gastrointestinal: Negative for abdominal pain and blood in stool.  Genitourinary: Positive for menstrual problem.  Musculoskeletal: Positive for arthralgias. Negative for back pain.  Skin: Positive for rash.  Allergic/Immunologic: Negative for immunocompromised state.  Hematological: Negative for adenopathy. Does not bruise/bleed easily.  Psychiatric/Behavioral: Negative for suicidal ideas and dysphoric mood.    Objective:  BP 140/83 mmHg  Pulse 114  Temp(Src) 97.6 F (36.4 C) (Oral)  Resp 16  Ht 5\' 2"  (1.575 m)   Wt 334 lb (151.501 kg)  BMI 61.07 kg/m2  SpO2 95%  LMP 12/13/2015  BP/Weight 12/25/2015 12/26/2014 09/12/2014  Systolic BP 140 130 127  Diastolic BP 83 92 84  Wt. (Lbs) 334 304 289  BMI 61.07 55.59 52.85   Pulse Readings from Last 3 Encounters:  12/25/15 114  12/26/14 96  09/12/14 102    Physical Exam  Constitutional: She is oriented to person, place, and time. She appears well-developed and well-nourished. No distress.  Obese   HENT:  Head: Normocephalic and atraumatic.    Cardiovascular: Normal rate, regular rhythm, normal heart sounds and intact distal pulses.   Pulmonary/Chest: Effort normal and breath sounds normal.  Musculoskeletal: She exhibits no edema.  Neurological: She is alert and oriented to person, place, and time.  Skin: Skin is warm and dry. No rash noted.  Psychiatric: She has a normal mood and affect.    Assessment & Plan:   There are no diagnoses linked to this encounter. [name] was seen today for hypertension.  Diagnoses and all orders for this visit:  Essential hypertension -     COMPLETE METABOLIC PANEL WITH GFR -     Discontinue: chlorthalidone (HYGROTON) 25 MG tablet; Take 1 tablet (25 mg total) by mouth daily. -     TSH -     chlorthalidone (HYGROTON) 25 MG tablet; Take 1 tablet (25 mg total) by mouth daily.  Hair loss -     Testosterone -  TSH  Foot pain, bilateral -     Discontinue: predniSONE (DELTASONE) 20 MG tablet; Take 1-2 tablets (20-40 mg total) by mouth daily with breakfast. -     predniSONE (DELTASONE) 20 MG tablet; Take 1-2 tablets (20-40 mg total) by mouth daily with breakfast.  Irregular menstrual cycle -     CBC  Screening for HIV (human immunodeficiency virus) -     HIV antibody (with reflex)   No orders of the defined types were placed in this encounter.    Follow-up: No Follow-up on file.   Dessa Phi MD

## 2015-12-26 ENCOUNTER — Encounter: Payer: Self-pay | Admitting: Clinical

## 2015-12-26 LAB — COMPLETE METABOLIC PANEL WITH GFR
ALBUMIN: 3.8 g/dL (ref 3.6–5.1)
ALK PHOS: 60 U/L (ref 33–115)
ALT: 16 U/L (ref 6–29)
AST: 18 U/L (ref 10–30)
BILIRUBIN TOTAL: 0.2 mg/dL (ref 0.2–1.2)
BUN: 7 mg/dL (ref 7–25)
CO2: 23 mmol/L (ref 20–31)
Calcium: 8.6 mg/dL (ref 8.6–10.2)
Chloride: 105 mmol/L (ref 98–110)
Creat: 0.62 mg/dL (ref 0.50–1.10)
GFR, Est African American: >89 mL/min (ref >=60)
Glucose, Bld: 111 mg/dL — ABNORMAL HIGH (ref 65–99)
Potassium: 4 mmol/L (ref 3.5–5.3)
Sodium: 140 mmol/L (ref 135–146)
TOTAL PROTEIN: 6.6 g/dL (ref 6.1–8.1)

## 2015-12-26 LAB — TESTOSTERONE: TESTOSTERONE: 54 ng/dL

## 2015-12-26 LAB — HIV ANTIBODY (ROUTINE TESTING W REFLEX): HIV: NONREACTIVE

## 2015-12-26 LAB — TSH: TSH: 3.1 m[IU]/L

## 2015-12-26 NOTE — Progress Notes (Signed)
[facility] 12/25/2015 12/26/2014 06/10/2014  Decreased Interest 0 0 0  Down, Depressed, Hopeless 0 0 0  PHQ - 2 Score 0 0 0  Altered sleeping 0 - -  Tired, decreased energy 2 - -  Change in appetite 0 - -  Feeling bad or failure about yourself  0 - -  Trouble concentrating 0 - -  Moving slowly or fidgety/restless 0 - -  Suicidal thoughts 0 - -  PHQ-9 Score 2 - -    GAD 7 : Generalized Anxiety Score 12/25/2015  Nervous, Anxious, on Edge 1  Control/stop worrying 0  Worry too much - different things 1  Trouble relaxing 0  Restless 0  Easily annoyed or irritable 0  Afraid - awful might happen 0  Total GAD 7 Score 2

## 2016-01-02 ENCOUNTER — Other Ambulatory Visit: Payer: Self-pay | Admitting: Family Medicine

## 2016-01-02 ENCOUNTER — Other Ambulatory Visit: Payer: 59

## 2016-01-02 DIAGNOSIS — R079 Chest pain, unspecified: Secondary | ICD-10-CM

## 2016-01-07 ENCOUNTER — Emergency Department (HOSPITAL_COMMUNITY)
Admission: EM | Admit: 2016-01-07 | Discharge: 2016-01-08 | Disposition: A | Discharge status: Home / Self Care | Payer: 59 | Attending: Emergency Medicine | Admitting: Emergency Medicine

## 2016-01-07 ENCOUNTER — Encounter (HOSPITAL_COMMUNITY): Payer: Self-pay | Admitting: Emergency Medicine

## 2016-01-07 DIAGNOSIS — I10 Essential (primary) hypertension: Secondary | ICD-10-CM | POA: Insufficient documentation

## 2016-01-07 DIAGNOSIS — N938 Other specified abnormal uterine and vaginal bleeding: Secondary | ICD-10-CM | POA: Insufficient documentation

## 2016-01-07 DIAGNOSIS — Z7952 Long term (current) use of systemic steroids: Secondary | ICD-10-CM | POA: Insufficient documentation

## 2016-01-07 DIAGNOSIS — N939 Abnormal uterine and vaginal bleeding, unspecified: Secondary | ICD-10-CM

## 2016-01-07 DIAGNOSIS — Z3202 Encounter for pregnancy test, result negative: Secondary | ICD-10-CM | POA: Insufficient documentation

## 2016-01-07 DIAGNOSIS — D500 Iron deficiency anemia secondary to blood loss (chronic): Secondary | ICD-10-CM | POA: Diagnosis not present

## 2016-01-07 DIAGNOSIS — Z79899 Other long term (current) drug therapy: Secondary | ICD-10-CM | POA: Insufficient documentation

## 2016-01-07 DIAGNOSIS — R000 Tachycardia, unspecified: Secondary | ICD-10-CM | POA: Insufficient documentation

## 2016-01-07 DIAGNOSIS — Z8639 Personal history of other endocrine, nutritional and metabolic disease: Secondary | ICD-10-CM | POA: Insufficient documentation

## 2016-01-07 LAB — POC URINE PREG, ED: Preg Test, Ur: NEGATIVE

## 2016-01-07 NOTE — ED Notes (Signed)
Reports heavy vaginal bleeding since 4/12 with clots.  Denies abd pain.

## 2016-01-08 LAB — CBC
HCT: 32.3 % — ABNORMAL LOW (ref 36.0–46.0)
Hemoglobin: 9.7 g/dL — ABNORMAL LOW (ref 12.0–15.0)
MCH: 23.2 pg — ABNORMAL LOW (ref 26.0–34.0)
MCHC: 30 g/dL (ref 30.0–36.0)
MCV: 77.3 fL — ABNORMAL LOW (ref 78.0–100.0)
PLATELETS: 314 10*3/uL (ref 150–400)
RBC: 4.18 MIL/uL (ref 3.87–5.11)
RDW: 13.8 % (ref 11.5–15.5)
WBC: 10 10*3/uL (ref 4.0–10.5)

## 2016-01-08 LAB — WET PREP, GENITAL
Sperm: NONE SEEN
TRICH WET PREP: NONE SEEN
YEAST WET PREP: NONE SEEN

## 2016-01-08 LAB — BASIC METABOLIC PANEL
ANION GAP: 10 (ref 5–15)
BUN: 7 mg/dL (ref 6–20)
CALCIUM: 9.2 mg/dL (ref 8.9–10.3)
CO2: 26 mmol/L (ref 22–32)
Chloride: 103 mmol/L (ref 101–111)
Creatinine, Ser: 0.83 mg/dL (ref 0.44–1.00)
Glucose, Bld: 140 mg/dL — ABNORMAL HIGH (ref 65–99)
POTASSIUM: 3.6 mmol/L (ref 3.5–5.1)
SODIUM: 139 mmol/L (ref 135–145)

## 2016-01-08 MED ORDER — MEDROXYPROGESTERONE ACETATE 5 MG PO TABS: 10.0000 mg | ORAL_TABLET | Freq: Every day | ORAL | Status: AC

## 2016-01-08 MED FILL — ?CHLORTHALIDONE 25 MG TABLE: 25 | 30 days supply | Qty: 30 | Fill #0

## 2016-01-08 MED FILL — predniSONE 20 MG TABS: 20 | 5 days supply | Qty: 10 | Fill #0

## 2016-01-08 NOTE — ED Provider Notes (Signed)
CSN: [id number]     Arrival date & time 01/07/16  2249 History   First MD Initiated Contact with Patient 01/07/16 2355     Chief Complaint  Patient presents with  . Vaginal Bleeding     (Consider location/radiation/quality/duration/timing/severity/associated sxs/prior Treatment) The history is provided by the patient and medical records. No language interpreter was used.     Felicia Bailey is a 25 y.o. female  G1 P0101 with a hx of HTN presents to the Emergency Department complaining of gradual, persistent, Vaginal bleeding onset 12/13/2015.  Patient reports that in the past her menses have been very regular but heavy. She said she normally bleeds approximately 5-6 days.  She reports that on April 12 she began spotting and by April 20 she was having heavy bleeding. She reports persistent heavy bleeding since that time requiring multiple pads per day with intermittent clotting. She denies abdominal pain, cramping, chest pain, shortness of breath, fevers, chills, nausea, vomiting, diarrhea. She denies change in medications or anything else. She reports a history of anemia but does not know her baseline hemoglobin. She reports she saw her primary care doctor last week and was tachycardic at that time. No blood work was taken and no pelvic exam was done. She denies any birth control usage. She denies any sexual partners for greater than one year. She denies vaginal discharge, odor, dysuria. No known aggravating or alleviating factors. No associated symptoms.  Past Medical History  Diagnosis Date  . Hypertension 2005     unsure  . Hypothyroidism 2012    on synthroid for a few months in 2012   . H/O: hypothyroidism     on synthroid for a few months in 2012     Past Surgical History  Procedure Laterality Date  . Cesarean section  2011  . Tonsillectomy  2001   Family History  Problem Relation Age of Onset  . Thyroid disease Mother   . Hypertension Father   . Diabetes Paternal Uncle   . Heart  disease Paternal Uncle   . Cancer Neg Hx    Social History  Substance Use Topics  . Smoking status: Never Smoker   . Smokeless tobacco: Never Used  . Alcohol Use: 4.2 oz/week    7 Cans of beer per week     Comment: 2 beer per day    OB History    Gravida Para Term Preterm AB TAB SAB Ectopic Multiple Living   1 1 1             Review of Systems  Constitutional: Negative for fever, diaphoresis, appetite change, fatigue and unexpected weight change.  HENT: Negative for mouth sores.   Eyes: Negative for visual disturbance.  Respiratory: Negative for cough, chest tightness, shortness of breath and wheezing.   Cardiovascular: Negative for chest pain.  Gastrointestinal: Negative for nausea, vomiting, abdominal pain, diarrhea and constipation.  Endocrine: Negative for polydipsia, polyphagia and polyuria.  Genitourinary: Positive for vaginal bleeding and menstrual problem. Negative for dysuria, urgency, frequency and hematuria.  Musculoskeletal: Negative for back pain and neck stiffness.  Skin: Negative for rash.  Allergic/Immunologic: Negative for immunocompromised state.  Neurological: Negative for syncope, light-headedness and headaches.  Hematological: Does not bruise/bleed easily.  Psychiatric/Behavioral: Negative for sleep disturbance. The patient is not nervous/anxious.       Allergies  Review of patient's allergies indicates no known allergies.  Home Medications   Prior to Admission medications   Medication Sig Start Date End Date Taking? Authorizing Provider  cetirizine (ZYRTEC) 10 MG tablet Take 1 tablet (10 mg total) by mouth daily. Patient not taking: Reported on 01/08/2016 12/26/14   Dessa Phi, MD  chlorthalidone (HYGROTON) 25 MG tablet Take 1 tablet (25 mg total) by mouth daily. 12/25/15   Josalyn Funches, MD  medroxyPROGESTERone (PROVERA) 5 MG tablet Take 2 tablets (10 mg total) by mouth daily. 01/08/16   Auguste Tebbetts, PA-C  predniSONE (DELTASONE) 20 MG  tablet Take 1-2 tablets (20-40 mg total) by mouth daily with breakfast. 12/25/15   Josalyn Funches, MD   BP 102/91 mmHg  Pulse 126  Temp(Src) 98.7 F (37.1 C) (Oral)  Resp 20  Ht  (1.575 m)  Wt 152.04 kg  BMI 61.29 kg/m2  SpO2 100%  LMP 12/13/2015 Physical Exam  Constitutional: She appears well-developed and well-nourished. No distress.  HENT:  Head: Normocephalic and atraumatic.  Eyes: Conjunctivae are normal.  Neck: Normal range of motion.  Cardiovascular: Regular rhythm, normal heart sounds and intact distal pulses.  Tachycardia present.   No murmur heard. Pulmonary/Chest: Effort normal and breath sounds normal. No respiratory distress. She has no wheezes.  Abdominal: Soft. Bowel sounds are normal. There is no tenderness. There is no rebound and no guarding. Hernia confirmed negative in the right inguinal area and confirmed negative in the left inguinal area.  Genitourinary: Uterus normal. No labial fusion. There is no rash, tenderness or lesion on the right labia. There is no rash, tenderness or lesion on the left labia. Uterus is not deviated, not enlarged, not fixed and not tender. Cervix exhibits no motion tenderness, no discharge and no friability. Right adnexum displays no mass, no tenderness and no fullness. Left adnexum displays no mass, no tenderness and no fullness. There is bleeding ( moderate, no clots) in the vagina. No erythema or tenderness in the vagina. No foreign body around the vagina. No signs of injury around the vagina. No vaginal discharge found.  Musculoskeletal: Normal range of motion. She exhibits no edema.  Lymphadenopathy:       Right: No inguinal adenopathy present.       Left: No inguinal adenopathy present.  Neurological: She is alert.  Skin: Skin is warm and dry. She is not diaphoretic. No erythema.  Psychiatric: She has a normal mood and affect.  Nursing note and vitals reviewed.   ED Course  Procedures (including critical care time) Labs  Review Labs Reviewed  WET PREP, GENITAL - Abnormal; Notable for the following:    Clue Cells Wet Prep HPF POC PRESENT (*)    WBC, Wet Prep HPF POC FEW (*)    All other components within normal limits  CBC - Abnormal; Notable for the following:    Hemoglobin 9.7 (*)    HCT 32.3 (*)    MCV 77.3 (*)    MCH 23.2 (*)    All other components within normal limits  BASIC METABOLIC PANEL - Abnormal; Notable for the following:    Glucose, Bld 140 (*)    All other components within normal limits  POC URINE PREG, ED     MDM   Final diagnoses:  Vaginal bleeding  Iron deficiency anemia due to chronic blood loss   Felicia Bailey presents with vaginal bleeding for several weeks.  Abdominal exam benign. Moderate amount of blood in the vaginal vault without clotting. Cervix is unremarkable. No cervical motion tenderness.  Patient will be given medroxyprogesterone to decrease vaginal bleeding and she will have follow-up with women's outpatient clinic or other OB/GYN for  further evaluation and treatment.    Pt noted to be tachycardic in the ED with improvement while here.  She reports this has been persistent since her last PCP visit.  No CP, SOB or palpitations.  May be related to her worsening anemia (9.7 today down from 11.0 on 12/25/15).  Discussed the importance of close follow-up for this and symptoms of symptomatic anemia that would warrant return.  No risk factors for PE, doubt this is the etiology of her tachycardia.    BP 110/85 mmHg  Pulse 108  Temp(Src) 98.3 F (36.8 C) (Oral)  Resp 19  Ht 5\' 2"  (1.575 m)  Wt 152.04 kg  BMI 61.29 kg/m2  SpO2 100%  LMP 12/13/2015   Dierdre ForthHannah Iysha Mishkin, PA-C 01/08/16 0211  Melene Planan Floyd, DO 01/08/16 [id number]

## 2016-01-08 NOTE — Discharge Instructions (Signed)
1. Medications: medroxyprogesterone, usual home medications 2. Treatment: rest, drink plenty of fluids,  3. Follow Up: Please followup with OB/GYN in 3-5 days for discussion of your diagnoses and further evaluation after today's visit; if you do not have a primary care doctor use the resource guide provided to find one; Please return to the ER for worsening symptoms, fatigue, CP, SOB or other concerns   Dysfunctional Uterine Bleeding Dysfunctional uterine bleeding is abnormal bleeding from the uterus. Dysfunctional uterine bleeding includes:  A period that comes earlier or later than usual.  A period that is lighter, heavier, or has blood clots.  Bleeding between periods.  Skipping one or more periods.  Bleeding after sexual intercourse.  Bleeding after menopause. HOME CARE INSTRUCTIONS  Pay attention to any changes in your symptoms. Follow these instructions to help with your condition: Eating  Eat well-balanced meals. Include foods that are high in iron, such as liver, meat, shellfish, green leafy vegetables, and eggs.  If you become constipated:  Drink plenty of water.  Eat fruits and vegetables that are high in water and fiber, such as spinach, carrots, raspberries, apples, and mango. Medicines  Take over-the-counter and prescription medicines only as told by your health care provider.  Do not change medicines without talking with your health care provider.  Aspirin or medicines that contain aspirin may make the bleeding worse. Do not take those medicines:  During the week before your period.  During your period.  If you were prescribed iron pills, take them as told by your health care provider. Iron pills help to replace iron that your body loses because of this condition. Activity  If you need to change your sanitary pad or tampon more than one time every 2 hours:  Lie in bed with your feet raised (elevated).  Place a cold pack on your lower abdomen.  Rest as  much as possible until the bleeding stops or slows down.  Do not try to lose weight until the bleeding has stopped and your blood iron level is back to normal. Other Instructions  For two months, write down:  When your period starts.  When your period ends.  When any abnormal bleeding occurs.  What problems you notice.  Keep all follow up visits as told by your health care provider. This is important. SEEK MEDICAL CARE IF:  You get light-headed or weak.  You have nausea and vomiting.  You cannot eat or drink without vomiting.  You feel dizzy or have diarrhea while you are taking medicines.  You are taking birth control pills or hormones, and you want to change them or stop taking them. SEEK IMMEDIATE MEDICAL CARE IF:  You develop a fever or chills.  You need to change your sanitary pad or tampon more than one time per hour.  Your bleeding becomes heavier, or your flow contains clots more often.  You develop pain in your abdomen.  You lose consciousness.  You develop a rash.   This information is not intended to replace advice given to you by your health care provider. Make sure you discuss any questions you have with your health care provider.   Document Released: 08/16/2000 Document Revised: 05/10/2015 Document Reviewed: 11/14/2014 Elsevier Interactive Patient Education [employer]2016 Elsevier Inc.

## 2017-08-18 ENCOUNTER — Ambulatory Visit: Admit: 2017-08-18 | Discharge: 2017-08-18 | Payer: MEDICAID | Attending: Family | Primary: Family

## 2017-08-18 DIAGNOSIS — I10 Essential (primary) hypertension: Secondary | ICD-10-CM

## 2017-08-18 MED ORDER — CHLORTHALIDONE 50 MG PO TABS
50 | ORAL_TABLET | Freq: Every day | ORAL | 3 refills | Status: DC
Start: 2017-08-18 — End: 2018-09-23

## 2017-08-18 MED ORDER — PREDNISONE 20 MG PO TABS
20 MG | ORAL_TABLET | Freq: Every day | ORAL | 1 refills | Status: DC
Start: 2017-08-18 — End: 2017-12-03

## 2017-08-18 NOTE — Progress Notes (Addendum)
[facility]  [facility]  [address]  [place] Main [place]  [place] [id number]  Dept: [phone]  Dept Fax: [phone]    08/18/2017    Crystal Wheeler (DOB:  04/01/1991) isa 26 y.o. female, here for evaluation of the following medical concerns:   Chief Complaint   Patient presents with   . Establish Care     HTN   . Health Maintenance     pt had flu vaccine in january 2018,          Sleep apnea as a child, had adenoids removed    Also had issue of chronic foot pain in 2014  Occurring without being on her feet or wearing poor shoes  Feels like she's stepping on rocks/stones  Worse with prolonged standing  ER gave prednisone, had relief with it  Also gets "jitters" in her legs  Last flare 1 week ago, ran out of prednisone    Also gets painless, random swelling in fingers, hands. Sometimes BUE.  Usually not legs  Has SLE and arthritis testing done  Advised to see rheumatology, was never able to make appt  This was in [place]    Out of BP meds currently      Hypertension   This is a chronic problem. The current episode started more than 1 year ago. Associated symptoms include headaches. Pertinent negatives include no anxiety, blurred vision, chest pain, orthopnea, palpitations, peripheral edema or shortness of breath. Risk factors for coronary artery disease include obesity. Past treatments include diuretics. There is no history of kidney disease, CAD/MI or heart failure. There is no history of a thyroid problem.   Foot Pain   This is a chronic problem. The problem occurs every several days. Associated symptoms include arthralgias, fatigue, headaches and joint swelling. Pertinent negatives include no chest pain, fever or visual change. She has tried NSAIDs and walking for the symptoms. The treatment provided no relief.         Review of Systems   Constitutional: Positive for fatigue. Negative for fever.   HENT: Negative for facial swelling.    Eyes: Negative for blurred vision, redness  and visual disturbance.   Respiratory: Negative for choking and shortness of breath.    Cardiovascular: Negative for chest pain, palpitations and orthopnea.   Genitourinary: Negative for menstrual problem.   Musculoskeletal: Positive for arthralgias and joint swelling.   Skin: Negative for wound.        Patchy hair loss   Neurological: Positive for headaches.   Hematological: Does not bruise/bleed easily.       Prior to Visit Medications    Medication Sig Taking? Authorizing Provider   chlorthalidone (HYGROTEN) 50 MG tablet Take 1 tablet by mouth daily Yes Curt Bearsachel J Chavonne Sforza, APRN - CNP   predniSONE (DELTASONE) 20 MG tablet Take 1 tablet by mouth daily for 10 days Yes Curt Bearsachel J Navpreet Szczygiel, APRN - CNP        No Known Allergies    Past Medical History:   Diagnosis Date   . Hypertension    . Hypothyroidism    . Obesity        Past Surgical History:   Procedure Laterality Date   . CESAREAN SECTION     . TONSILLECTOMY AND ADENOIDECTOMY         Social History     Social History   . Marital status: Single     Spouse name: N/A   . Number of  children: N/A   . Years of education: N/A     Occupational History   . Not on file.     Social History Main Topics   . Smoking status: Never Smoker   . Smokeless tobacco: Never Used   . Alcohol use 3.6 oz/week     6 Cans of beer per week      Comment: 6 pack a week   . Drug use: No   . Sexual activity: No     Other Topics Concern   . Not on file     Social History Narrative   . No narrative on file        Family History   Problem Relation Age of Onset   . Anemia Mother    . High Blood Pressure Mother    . Miscarriages / [place] Mother    . Diabetes Father    . High Blood Pressure Father    . Heart Disease Maternal Aunt    . Alcohol Abuse Maternal Uncle    . Allergy (Severe) Maternal Cousin    . Allergy (Severe) Paternal Cousin        Vitals:    08/18/17 0941 08/18/17 1031   BP: (!) 169/103 (!) 169/106   Site: Right Lower Arm Right Lower Arm   Position: Sitting Sitting   Cuff Size: Medium  Adult Medium Adult   Pulse: 95    Temp: 97.3 F (36.3 C)    SpO2: 98%    Weight: (!) 318 lb (144.2 kg)    Height: 5\' 2"  (1.575 m)      Estimated body mass index is 58.16 kg/m as calculated from the following:    Height as of this encounter: 5\' 2"  (1.575 m).    Weight as of this encounter: 318 lb (144.2 kg).    Physical Exam   Constitutional: She is oriented to person, place, and time. She appears well-developed and well-nourished. She is cooperative. No distress.   HENT:   Head: Normocephalic.   Eyes: Pupils are equal, round, and reactive to light. No scleral icterus.   Neck: Normal range of motion. Neck supple.   Cardiovascular: Normal rate and regular rhythm.    Pulmonary/Chest: Effort normal and breath sounds normal.   Abdominal: Soft.   Musculoskeletal: She exhibits no edema.   Neurological: She is alert and oriented to person, place, and time. Coordination normal.   Skin: Skin is warm and dry.   Psychiatric: She has a normal mood and affect. Her behavior is normal. Judgment and thought content normal.   Nursing note and vitals reviewed.    STOP   1.Do you snore loudly (loud enough to be heard through closed doors)? Yes   2. Do you often feel TIRED, fatigued, or sleepy during Daytime? Yes   3. Has anyone OBSERVED you stop breathing during your sleep? No   4. Do you have or are you being treated for high blood PRESSURE? Yes   BANG   1. BMI more than 35kg/m2? 58.16 kg/m   2. AGE over 29 years old? 26 y.o.   3. NECK circumference > 16 inches (40cm)? yes   4. Female? No     SCORE:   High risk: 5-8       ASSESSMENT      ICD-10-CM    1. Essential hypertension I10 chlorthalidone (HYGROTEN) 50 MG tablet     Assessment of Daytime Sleepiness     TSH With Reflex Ft4  CBC Auto Differential     Comprehensive Metabolic Panel, Fasting   2. At risk for sleep apnea Z91.89 Assessment of Daytime Sleepiness   3. Arthralgia of both feet M79.671 [facility] [place] Beach[place]berger, Edward, MD    [phone]     4. Hypothyroidism, unspecified type E03.9 CBC Auto Differential         PLAN:  Orders Placed This Encounter   Medications   . chlorthalidone (HYGROTEN) 50 MG tablet     Sig: Take 1 tablet by mouth daily     Dispense:  30 tablet     Refill:  3   . predniSONE (DELTASONE) 20 MG tablet     Sig: Take 1 tablet by mouth daily for 10 days     Dispense:  10 tablet     Refill:  1         1.  5/8 STOPBANG score, send for OSA testing. Pt agrees  2. Referral to Rheumatology per request  3. Basic labs ordered, including CBC and TSH    Adverse drug reactions discussed with patient  Patient verbalized understanding of all instructions given.    Return in about 4 weeks (around 09/15/2017) for HTN.    An  electronic signature was used to authenticate this note.    --Curt Bearsachel J Lakiyah Arntson, APRN - CNP on 08/18/2017 at 10:54 AM  Visit Information    Have you changed or started any medications since your last visit including any over-the-counter medicines, vitamins, or herbal medicines? yes - magnesium    Are you having any side effects from any of your medications? -  no  Have you stopped taking any of your medications? Is so, why? -  no    Have you seen any other physician or provider since your last visit? No  Have you had any other diagnostic tests since your last visit? Yes - [facility]   Have you been seen in the emergency room and/or had an admission to a hospital since we last saw you? Yes - [facility]  Have you had your routine dental cleaning in the past 6 months? no    Have you activated your MyChart account? If not, what are your barriers? No: signing up    Patient Care Team:  Curt Bearsachel J Madalynne Gutmann, APRN - CNP as PCP - General (Family Medicine)    Medical History Review  Past Medical, Family, and Social History reviewed and does contribute to the patient presenting condition    Health Maintenance   Topic Date Due   . Potassium monitoring  [birth year] 1989   . Creatinine  monitoring  Mar 31, 1991   . HIV screen  04/07/2004   . DTaP/Tdap/Td vaccine (1 - Tdap) 04/07/2008   . Cervical cancer screen  04/07/2010   . Flu vaccine (1) 05/03/2017

## 2017-09-12 ENCOUNTER — Telehealth

## 2017-09-12 NOTE — Telephone Encounter (Signed)
[facility] called requesting an order for a baseline sleep study instead of a "daytime assessment of sleepiness." They are also requesting that you list 2 sxs that she is having in the note section on the new order. Thank you!!!        Health Maintenance   Topic Date Due   . Potassium monitoring  26-Apr-1991   . Creatinine monitoring  26-Apr-1991   . HIV screen  04/07/2004   . DTaP/Tdap/Td vaccine (1 - Tdap) 04/07/2008   . Cervical cancer screen  04/07/2010   . Flu vaccine (1) 05/03/2017             (applicable per patient's age: Cancer Screenings, Depression Screening, Fall Risk Screening, Immunizations)    No results found for: LABA1C, LABMICR, LDLCHOLESTEROL, LDLCALC, AST, ALT, BUN   (goal A1C is < 7)   (goal LDL is <100) need 30-50% reduction from baseline     BP Readings from Last 3 Encounters:   08/18/17 (!) 169/106    (goal BP 120/80)      All Future Testing planned in [facility]:  Lab Frequency Next Occurrence   Assessment of Daytime Sleepiness Once 08/18/2017   TSH With Reflex Ft4 Once 09/17/2017   CBC Auto Differential Once 09/17/2017   Comprehensive Metabolic Panel, Fasting Once 09/17/2017       Next Visit Date:  Future Appointments  Date Time Provider Department Center   09/18/2017 8:15 AM Curt Bearsachel J Brooks, APRN - CNP [facility]            There is no problem list on file for this patient.

## 2017-09-18 ENCOUNTER — Encounter: Attending: Family | Primary: Family

## 2017-09-21 DIAGNOSIS — Z9189 Other specified personal risk factors, not elsewhere classified: Secondary | ICD-10-CM

## 2017-09-22 ENCOUNTER — Inpatient Hospital Stay: Admit: 2017-09-22 | Payer: MEDICAID | Primary: Family

## 2017-09-24 ENCOUNTER — Telehealth

## 2017-09-24 NOTE — Telephone Encounter (Signed)
[facility] following recent sleep study requesting order for CPAP Titration Study now, order pended.

## 2017-09-27 LAB — BASELINE DIAGNOSTIC SLEEP STUDY

## 2017-09-29 NOTE — Telephone Encounter (Signed)
erroneous encounter

## 2017-09-30 ENCOUNTER — Encounter: Attending: Family | Primary: Family

## 2017-09-30 NOTE — Telephone Encounter (Signed)
Called pt to inform of missed appt and reschedule

## 2017-10-07 ENCOUNTER — Encounter: Attending: Family | Primary: Family

## 2017-10-08 NOTE — Telephone Encounter (Signed)
Left vm for pt to call back and r/s missed appt from yesterday

## 2017-10-28 DIAGNOSIS — G4733 Obstructive sleep apnea (adult) (pediatric): Secondary | ICD-10-CM

## 2017-10-29 ENCOUNTER — Inpatient Hospital Stay: Admit: 2017-10-29 | Payer: MEDICAID | Primary: Family

## 2017-11-04 ENCOUNTER — Ambulatory Visit: Admit: 2017-11-04 | Discharge: 2017-11-04 | Payer: MEDICAID | Attending: Family | Primary: Family

## 2017-11-04 DIAGNOSIS — I10 Essential (primary) hypertension: Secondary | ICD-10-CM

## 2017-11-04 NOTE — Progress Notes (Signed)
[facility] PHYSICIANS  [facility]  [address]  [address]  [place] [address]  Dept: [phone]  Dept Fax: [phone]    11/04/2017     Crystal Wheeler (DOB:  [birth year])is a 27 y.o. female, here for evaluation of the following medical concerns:   Chief Complaint   Patient presents with   . 1 Month Follow-Up       Hypertension   This is a chronic problem. The current episode started more than 1 year ago. Associated symptoms include headaches. Pertinent negatives include no anxiety, blurred vision, chest pain, orthopnea, palpitations, peripheral edema or shortness of breath. Risk factors for coronary artery disease include obesity. Past treatments include diuretics. There is no history of kidney disease, CAD/MI or heart failure. There is no history of a thyroid problem.     Did have OSA testing, just completed titration study, felt a difference that night    Often forgetting her medication, did not take it today    .    Review of Systems   Constitutional: Negative for appetite change, fever and unexpected weight change.   HENT: Negative for hearing loss and trouble swallowing.    Eyes: Negative for visual disturbance.   Respiratory: Negative for shortness of breath and wheezing.    Cardiovascular: Negative for chest pain and palpitations.   Gastrointestinal: Negative for abdominal pain, blood in stool, diarrhea and vomiting.   Endocrine: Negative for polydipsia and polyuria.   Genitourinary: Negative for frequency and hematuria.   Musculoskeletal: Positive for arthralgias. Negative for myalgias and neck pain.   Neurological: Negative for seizures, numbness and headaches.   Psychiatric/Behavioral: Negative for suicidal ideas. The patient is not nervous/anxious.        Prior to Visit Medications    Medication Sig Taking? Authorizing Provider   chlorthalidone (HYGROTEN) 50 MG tablet Take 1 tablet by mouth daily Yes Curt Bears, APRN - CNP        Social History     Tobacco Use    . Smoking status: Never Smoker   . Smokeless tobacco: Never Used   Substance Use Topics   . Alcohol use: Yes     Alcohol/week: 3.6 oz     Types: 6 Cans of beer per week     Comment: 6 pack a week        Vitals:    11/04/17 1336   BP: (!) 155/94   Site: Right Lower Arm   Position: Sitting   Cuff Size: Medium Adult   Pulse: 86   Temp: 97.3 F (36.3 C)   SpO2: 99%   Weight: (!) 328 lb (148.8 kg)     Estimated body mass index is 59.99 kg/m as calculated from the following:    Height as of 08/18/17: 5\' 2"  (1.575 m).    Weight as of this encounter: 328 lb (148.8 kg).    DIAGNOSTIC FINDINGS:  CBC:No results found for: WBC, HGB, PLT    BMP:  No results found for: NA, K, CL, CO2, BUN, CREATININE, GLUCOSE    HEMOGLOBIN A1C: No results found for: LABA1C    FASTING LIPID PANEL:No results found for: CHOL, HDL, TRIG    Physical Exam   Constitutional: She is oriented to person, place, and time. She appears well-developed and well-nourished. She is cooperative. No distress.   HENT:   Head: Normocephalic.   Eyes: Pupils are equal, round, and reactive to light. No scleral icterus.   Neck: Normal  range of motion. Neck supple.   Cardiovascular: Normal rate and regular rhythm.   Pulmonary/Chest: Effort normal and breath sounds normal.   Abdominal: Soft.   Musculoskeletal: She exhibits no edema.   Neurological: She is alert and oriented to person, place, and time. Coordination normal.   Skin: Skin is warm and dry.   Psychiatric: She has a normal mood and affect. Her behavior is normal. Judgment and thought content normal.   Nursing note and vitals reviewed.      ASSESSMENT     Diagnosis Orders   1. Essential hypertension     2. OSA (obstructive sleep apnea)            PLAN:  No orders of the defined types were placed in this encounter.        1. Heavily discussed medication adherence: using pill boxes or setting phone alert daily.  2. Expect BP improvement with OSA control, will re-evaluate in 2 months. No changes to meds today as  she did not take her pills before today's visit.   3. Re-faxed referral to rheumatology, she states she could not schedule because they did not receive the referral    FOLLOW UP AND INSTRUCTIONS:  No follow-ups on file.     Toniya received counseling on the following healthy behaviors:medication adherence     Discussed use, benefit, and side effects of prescribed medications.  Barriers to  medication compliance addressed.  All patient questions answered.  Pt  verbalized understanding of all instructions given.     Patient given educational materials - see patient instructions       Patient advised to contact scheduling offices for any referrals or imaging orders  placed today if they have not been contacted in 48 hours.         No follow-ups on file.    An electronic signature was used to authenticate this note.    --Curt Bears, APRN - CNP on 11/04/2017 at 2:11 PM  Visit Information    Have you changed or started any medications since your last visit including any over-the-counter medicines, vitamins, or herbal medicines? yes - multi vit   Are you having any side effects from any of your medications? -  no  Have you stopped taking any of your medications? Is so, why? -  no    Have you seen any other physician or provider since your last visit? no  Have you had any other diagnostic tests since your last visit? Yes - Records Requested  Have you been seen in the emergency room and/or had an admission to a hospital since we last saw you? No  Have you had your routine dental cleaning in the past 6 months? no    Have you activated your MyChart account? If not, what are your barriers? Yes     Patient Care Team:  Curt Bears, APRN - CNP as PCP - General (Family Medicine)    Medical History Review  Past Medical, Family, and Social History reviewed and does not contribute to the patient presenting condition    Health Maintenance   Topic Date Due   . Potassium monitoring  02/08/89   . Creatinine monitoring   1989-06-28   . Varicella Vaccine (1 of 2 - 13+ 2-dose series) 04/07/2002   . HIV screen  04/07/2004   . DTaP/Tdap/Td vaccine (1 - Tdap) 04/07/2008   . Cervical cancer screen  04/07/2010   . Flu vaccine (1) 05/03/2017

## 2017-11-07 LAB — SLEEP STUDY WITH PAP TITRATION

## 2017-12-02 NOTE — Telephone Encounter (Signed)
Patient called for a refill on Prednisone. She states at her last visit you guys discussed her taking this medication until she see Rheumatology. She also states she was told she would have 3 refills on this medication. Please review

## 2017-12-03 NOTE — Telephone Encounter (Signed)
Pt states that her appt with rheumatology is on 8/29.

## 2017-12-03 NOTE — Telephone Encounter (Signed)
We refaxed the referral last month, when is her appointment with rheumatology?

## 2017-12-04 MED ORDER — PREDNISONE 10 MG PO TABS
10 MG | ORAL_TABLET | Freq: Every day | ORAL | 3 refills | Status: AC
Start: 2017-12-04 — End: 2018-01-03

## 2017-12-04 NOTE — Telephone Encounter (Signed)
Health Maintenance   Topic Date Due   . Potassium monitoring  12/19/88   . Creatinine monitoring  1989-07-14   . Varicella Vaccine (1 of 2 - 13+ 2-dose series) 04/07/2002   . HIV screen  04/07/2004   . DTaP/Tdap/Td vaccine (1 - Tdap) 04/07/2008   . Cervical cancer screen  04/07/2010   . Flu vaccine (Season Ended) 05/03/2018             (applicable per patient's age: Cancer Screenings, Depression Screening, Fall Risk Screening, Immunizations)    No results found for: LABA1C, LABMICR, LDLCHOLESTEROL, LDLCALC, AST, ALT, BUN   (goal A1C is < 7)   (goal LDL is <100) need 30-50% reduction from baseline     BP Readings from Last 3 Encounters:   11/04/17 (!) 157/94   08/18/17 (!) 169/106    (goal BP 120/80)      All Future Testing planned in [facility]:  Lab Frequency Next Occurrence   Assessment of Daytime Sleepiness Once 08/18/2017   TSH With Reflex Ft4 Once 01/31/2018   CBC Auto Differential Once 01/31/2018   Comprehensive Metabolic Panel, Fasting Once 01/31/2018       Next Visit Date:  Future Appointments   Date Time Provider Department Center   01/06/2018  8:45 AM Curt Bears, APRN - CNP [facility]            There is no problem list on file for this patient.

## 2017-12-04 NOTE — Telephone Encounter (Signed)
Pt called in again requesting refill on prednisone. She states that her appt with rheumatology is scheduled for 8/29, which was the soonest they could get her in.

## 2017-12-04 NOTE — Telephone Encounter (Signed)
Patient informed and voiced understanding.

## 2017-12-04 NOTE — Telephone Encounter (Signed)
I am comfortable at keeping her at 10 mg a day until she is seen, 20 mg a day is too high a dose to continue daily until August. I did send that refill in.

## 2018-01-06 ENCOUNTER — Encounter: Attending: Family | Primary: Family

## 2018-02-02 ENCOUNTER — Encounter: Payer: MEDICAID | Attending: Family | Primary: Family

## 2018-02-03 NOTE — Telephone Encounter (Signed)
lvm for pt to call back and r/s missed appt from 6-3

## 2018-03-11 ENCOUNTER — Encounter: Attending: Family | Primary: Family

## 2018-06-12 ENCOUNTER — Encounter

## 2018-06-18 ENCOUNTER — Encounter: Attending: Family | Primary: Family

## 2018-09-23 ENCOUNTER — Ambulatory Visit: Admit: 2018-09-23 | Discharge: 2018-09-23 | Attending: Family | Primary: Family

## 2018-09-23 DIAGNOSIS — G4733 Obstructive sleep apnea (adult) (pediatric): Secondary | ICD-10-CM

## 2018-09-23 MED ORDER — FAMOTIDINE 20 MG PO TABS
20 MG | ORAL_TABLET | Freq: Two times a day (BID) | ORAL | 0 refills | Status: DC
Start: 2018-09-23 — End: 2023-09-26

## 2018-09-23 MED ORDER — CHLORTHALIDONE 50 MG PO TABS
50 MG | ORAL_TABLET | Freq: Every day | ORAL | 3 refills | Status: AC
Start: 2018-09-23 — End: 2023-09-26

## 2018-09-23 NOTE — Progress Notes (Signed)
[facility]  [place] [facility]  [address]  [facility] [address]  [place] [id number]  Dept: [phone]  Dept Fax: [phone]    Patient Care Team:  Curt Bearsachel J Jentry Mcqueary, APRN - CNP as PCP - General (Family Medicine)  Curt Bearsachel J Taym Twist, APRN - CNP as PCP - [facility] Empaneled Provider    09/23/2018     Crystal Wheeler (DOB:  03/03/1991)is a 28 y.o. female, here for evaluation of the following medical concerns:   Chief Complaint   Patient presents with   ??? Medication Refill       Had OSA testing last year, CPAP was ordered by sleep specialist but somehow it was never mailed to her. She is asking today for a new prescription    Has an issue of chronic foot pain in as of 2014  Occurring without being on her feet or wearing poor shoes  Feels like she's stepping on rocks/stones  Worse with prolonged standing  ER gave prednisone, had relief with it    Also gets painless, random swelling in fingers, hands. Sometimes BUE.  Usually not legs  Has SLE and arthritis testing done  Advised to see rheumatology, did go in September but missed her follow up and also needs labs drawn    Out of BP meds currently  Admit her thyroid level was slightly off in the past    Urticaria   This is a new problem. The current episode started more than 1 month ago. The affected locations include the left hand, left arm, right hand and right arm. The rash is characterized by itchiness and redness. Pertinent negatives include no congestion, cough, diarrhea, facial edema, fever, shortness of breath or vomiting.   Hypertension   This is a chronic problem. The current episode started more than 1 year ago. The problem is uncontrolled. Associated symptoms include headaches. Pertinent negatives include no anxiety, blurred vision, chest pain, neck pain, orthopnea, palpitations, peripheral edema or shortness of breath. Risk factors for coronary artery disease include obesity. Past treatments include diuretics. There is no history of  kidney disease, CAD/MI or heart failure. Identifiable causes of hypertension include sleep apnea. There is no history of a thyroid problem.   Foot Pain    This is a chronic problem. The problem occurs every several days. Pertinent negatives include no fever or numbness. She has tried NSAIDs and walking for the symptoms. The treatment provided no relief.     .    Review of Systems   Constitutional: Negative for appetite change, fever and unexpected weight change.   HENT: Negative for congestion, hearing loss and trouble swallowing.    Eyes: Negative for blurred vision and visual disturbance.   Respiratory: Positive for apnea. Negative for cough, shortness of breath and wheezing.    Cardiovascular: Negative for chest pain, palpitations and orthopnea.   Gastrointestinal: Negative for abdominal pain, blood in stool, diarrhea and vomiting.   Endocrine: Negative for polydipsia and polyuria.   Genitourinary: Negative for dysuria, frequency and hematuria.   Musculoskeletal: Positive for arthralgias. Negative for myalgias and neck pain.   Skin: Positive for rash (intermittent on BUE).   Neurological: Positive for headaches. Negative for seizures and numbness.   Psychiatric/Behavioral: Negative for suicidal ideas. The patient is not nervous/anxious.        Prior to Visit Medications    Medication Sig Taking? Authorizing Provider   chlorthalidone (HYGROTEN) 50 MG tablet Take 1 tablet by mouth daily Yes Manley Masonachel J  Shon BatonBrooks, APRN - CNP   famotidine (PEPCID) 20 MG tablet Take 1 tablet by mouth 2 times daily Yes Curt Bearsachel J Zayquan Bogard, APRN - CNP        Social History     Tobacco Use   ??? Smoking status: Never Smoker   ??? Smokeless tobacco: Never Used   Substance Use Topics   ??? Alcohol use: Yes     Alcohol/week: 6.0 standard drinks     Types: 6 Cans of beer per week     Comment: 6 pack a week        Vitals:    09/23/18 0957 09/23/18 1044   BP: (!) 158/98 (!) 155/98   Site: Right Lower Arm    Position: Sitting    Cuff Size: Medium Adult     Pulse: 101 90   Temp: 98.1 ??F (36.7 ??C)    TempSrc: Oral    SpO2: 97%    Weight: (!) 339 lb (153.8 kg)    Height: 5\' 2"  (1.575 m)      Estimated body mass index is 62 kg/m?? as calculated from the following:    Height as of this encounter: 5\' 2"  (1.575 m).    Weight as of this encounter: 339 lb (153.8 kg).    DIAGNOSTIC FINDINGS:  CBC:No results found for: WBC, HGB, PLT    BMP:  No results found for: NA, K, CL, CO2, BUN, CREATININE, GLUCOSE    HEMOGLOBIN A1C: No results found for: LABA1C    FASTING LIPID PANEL:No results found for: CHOL, HDL, TRIG    Physical Exam  Vitals signs and nursing note reviewed.   Constitutional:       General: She is not in acute distress.     Appearance: She is well-developed. She is obese.   HENT:      Head: Normocephalic.      Mouth/Throat:      Mouth: Mucous membranes are moist.   Eyes:      General: No scleral icterus.     Pupils: Pupils are equal, round, and reactive to light.   Neck:      Musculoskeletal: Normal range of motion and neck supple.      Comments: acanthosis  Cardiovascular:      Rate and Rhythm: Normal rate and regular rhythm.   Pulmonary:      Effort: Pulmonary effort is normal.      Breath sounds: Normal breath sounds.   Abdominal:      Palpations: Abdomen is soft.   Skin:     General: Skin is warm and dry.      Findings: Rash present. Rash is urticarial (left hand and forearm).   Neurological:      Mental Status: She is alert and oriented to person, place, and time.      Coordination: Coordination normal.   Psychiatric:         Behavior: Behavior normal. Behavior is cooperative.         Thought Content: Thought content normal.         Judgment: Judgment normal.         ASSESSMENT     Diagnosis Orders   1. OSA (obstructive sleep apnea)  [facility] [place]   2. Essential hypertension  Comprehensive Metabolic Panel    chlorthalidone (HYGROTEN) 50 MG tablet   3. Hypothyroidism, unspecified type     4. Urticaria  famotidine (PEPCID) 20 MG tablet           PLAN:  Orders Placed This Encounter   Medications   ??? chlorthalidone (HYGROTEN) 50 MG tablet     Sig: Take 1 tablet by mouth daily     Dispense:  30 tablet     Refill:  3   ??? famotidine (PEPCID) 20 MG tablet     Sig: Take 1 tablet by mouth 2 times daily     Dispense:  60 tablet     Refill:  0         1.  PSG results reviewed. Auto CPAP with 6-12cm H2O heated humidification, EPR of 2 with simplus fullface mask ordered. She will call with fax and I will send order with condition that she does need to f.u with pul,onology for maintnence and monitoring of this condition  2. Restart diuretic today, labs ordered  3. Short course H2 blocker as she is already taking benadryl PRN, no identifiable cause and no lesions suspicious for insect bites  4. TSH and vitamin D ordered through Rheumatology as well as other labs, she is go to complete those and I will call for the results    FOLLOW UP AND INSTRUCTIONS:  Return in about 6 weeks (around 11/04/2018) for HTN, OSA.    ?? Khalessi received counseling on the following healthy behaviors:nutrition and medication adherence    ?? Discussed use, benefit, and side effects of prescribed medications.  Barriers to  medication compliance addressed.  All patient questions answered.  Pt  verbalized understanding of all instructions given.    ?? Patient given educational materials - see patient instructions      ?? Patient advised to contact scheduling offices for any referrals or imaging orders  placed today if they have not been contacted in 48 hours.         Return in about 6 weeks (around 11/04/2018) for HTN, OSA.    An electronic signature was used to authenticate this note.    --Curt Bears, APRN - CNP on 09/23/2018 at 11:00 AM  Visit Information    Have you changed or started any medications since your last visit including any over-the-counter medicines, vitamins, or herbal medicines? no   Are you having any side effects from any of your medications? -  no  Have you stopped taking any  of your medications? Is so, why? -  Yes, due to no refill    Have you seen any other physician or provider since your last visit? No  Have you had any other diagnostic tests since your last visit? No  Have you been seen in the emergency room and/or had an admission to a hospital since we last saw you? No  Have you had your routine dental cleaning in the past 6 months? no    Have you activated your MyChart account? If not, what are your barriers? Yes     Patient Care Team:  Curt Bears, APRN - CNP as PCP - General (Family Medicine)  Curt Bears, APRN - CNP as PCP - [facility] Empaneled Provider    Medical History Review  Past Medical, Family, and Social History reviewed and does not contribute to the patient presenting condition    Health Maintenance   Topic Date Due   ??? TSH testing  Dec 22, 1988   ??? Potassium monitoring  11/10/1988   ??? Creatinine monitoring  04-Oct-1988   ??? DTaP/Tdap/Td vaccine (3 - Tdap) 04/07/2000   ??? HIV screen  04/07/2004   ??? Cervical cancer screen  04/07/2010   ??? Flu  vaccine (1) 05/03/2018   ??? Varicella Vaccine (1 of 2 - 2-dose childhood series) 10/14/2018 (Originally 04/07/1990)   ??? Pneumococcal 0-64 years Vaccine  Aged Out

## 2018-09-23 NOTE — Patient Instructions (Signed)
Patient Education        Healthy Upper Back: Exercises  Introduction  Here are some examples of exercises for your upper back. Start each exercise slowly. Ease off the exercise if you start to have pain.  Your doctor or physical therapist will tell you when you can start these exercises and which ones will work best for you.  How to do the exercises  Lower neck and upper back stretch   1. Stretch your arms out in front of your body. Clasp one hand on top of your other hand.  2. Gently reach out so that you feel your shoulder blades stretching away from each other.  3. Gently bend your head forward.  4. Hold for 15 to 30 seconds.  5. Repeat 2 to 4 times.    Midback stretch   1. Kneel on the floor, and sit back on your ankles.  2. Lean forward, place your hands on the floor, and stretch your arms out in front of you. Rest your head between your arms.  3. Gently push your chest toward the floor, reaching as far in front of you as possible.  4. Hold for 15 to 30 seconds.  5. Repeat 2 to 4 times.    Shoulder rolls   1. Sit comfortably with your feet shoulder-width apart. You can also do this exercise while standing.  2. Roll your shoulders up, then back, and then down in a smooth, circular motion.  3. Repeat 2 to 4 times.    Wall push-up   1. Stand against a wall with your feet about 12 to 24 inches back from the wall. If you feel any pain when you do this exercise, stand closer to the wall.  2. Place your hands on the wall slightly wider apart than your shoulders, and lean forward.  3. Gently lean your body toward the wall. Then push back to your starting position. Keep the motion smooth and controlled.  4. Repeat 8 to 12 times.    Resisted shoulder blade squeeze   1. Sit or stand, holding the band in both hands in front of you. Keep your elbows close to your sides, bent at a 90-degree angle. Your palms should face up.  2. Squeeze your shoulder blades together, and move your arms to the outside, stretching the band.  Be sure to keep your elbows at your sides while you do this.  3. Relax.  4. Repeat 8 to 12 times.    Resisted rows   1. Put the band around a solid object, such as a bedpost, at about waist level. Hold one end of the band in each hand.  2. With your elbows at your sides and bent to 90 degrees, pull the band back to move your shoulder blades toward each other. Return to the starting position.  3. Repeat 8 to 12 times.    Follow-up care is a key part of your treatment and safety. Be sure to make and go to all appointments, and call your doctor if you are having problems. It's also a good idea to know your test results and keep a list of the medicines you take.  Where can you learn more?  Go to [email or web address] and sign in to your MyChart account. Enter T680 in the Search Health Information box to learn more about "Healthy Upper Back: Exercises."     If you do not have an account, please click on the "Sign Up Now" link.  Current as of:   February 25, 2018  Content Version: 12.3  ?? 2006-2019 [employer]. Care instructions adapted under license by [facility]. If you have questions about a medical condition or this instruction, always ask your healthcare professional. [employer] disclaims any warranty or liability for your use of this information.

## 2018-09-29 NOTE — Telephone Encounter (Signed)
Pharmacy states that pepcid is unavailable, would you like to switch to zantac?

## 2018-11-04 ENCOUNTER — Encounter: Attending: Family | Primary: Family

## 2018-11-11 ENCOUNTER — Encounter: Attending: Family | Primary: Family

## 2018-11-12 ENCOUNTER — Encounter: Attending: Pulmonary Disease | Primary: Family

## 2018-12-22 NOTE — Telephone Encounter (Signed)
Pt is unable to get an appt with Rheumatology at this time.  She is asking if you can refill PREDNISONE 10MG  to [facility] on [address] unit[address] she can see specialist.      Last appt 09/23/2018  Next appt 12/30/2018 VV

## 2018-12-22 NOTE — Telephone Encounter (Signed)
No I cannot continue that medication long-term.  Needs further evaluation.  Work I do to help with rheumatologist?

## 2018-12-22 NOTE — Telephone Encounter (Signed)
Health Maintenance   Topic Date Due   ??? TSH testing  05/14/1989   ??? Potassium monitoring  11/01/1988   ??? Creatinine monitoring  10/02/1988   ??? Varicella vaccine (1 of 2 - 2-dose childhood series) 04/07/1990   ??? HIV screen  04/07/2004   ??? DTaP/Tdap/Td vaccine (3 - Tdap) 04/07/2008   ??? Cervical cancer screen  04/07/2010   ??? Flu vaccine (Season Ended) 05/04/2019   ??? Hib vaccine  Completed   ??? Hepatitis A vaccine  Aged Out   ??? Hepatitis B vaccine  Aged Out   ??? Meningococcal (ACWY) vaccine  Aged Out   ??? Pneumococcal 0-64 years Vaccine  Aged Out             (applicable per patient's age: Cancer Screenings, Depression Screening, Fall Risk Screening, Immunizations)    No results found for: LABA1C, LABMICR, LDLCHOLESTEROL, LDLCALC, AST, ALT, BUN   (goal A1C is < 7)   (goal LDL is <100) need 30-50% reduction from baseline     BP Readings from Last 3 Encounters:   09/23/18 (!) 155/98   11/04/17 (!) 157/94   08/18/17 (!) 169/106    (goal BP 120/80)      All Future Testing planned in [facility]:  Lab Frequency Next Occurrence   Comprehensive Metabolic Panel Once 12/31/2018       Next Visit Date:  Future Appointments   Date Time Provider Department Center   12/30/2018  9:15 AM Rachel J Brooks, APRN - CNP [facility]   03/10/2019  9:00 AM Srinivas Katragadda, MD Resp Spec [facility]            Patient Active Problem List:     Hypothyroidism     Hypertension

## 2018-12-25 NOTE — Telephone Encounter (Signed)
She needs to contact her rheumatologist and have them refill this medication please.  At our last visit she was to follow-up with them

## 2018-12-25 NOTE — Telephone Encounter (Signed)
Health Maintenance   Topic Date Due   ??? TSH testing  30-Aug[birth year]1989   ??? Potassium monitoring  September 01, 1989   ??? Creatinine monitoring  Aug 29, 1989   ??? Varicella vaccine (1 of 2 - 2-dose childhood series) 04/07/1990   ??? HIV screen  04/07/2004   ??? DTaP/Tdap/Td vaccine (3 - Tdap) 04/07/2008   ??? Cervical cancer screen  04/07/2010   ??? Flu vaccine (Season Ended) 05/04/2019   ??? Hib vaccine  Completed   ??? Hepatitis A vaccine  Aged Out   ??? Hepatitis B vaccine  Aged Out   ??? Meningococcal (ACWY) vaccine  Aged Out   ??? Pneumococcal 0-64 years Vaccine  Aged Out             (applicable per patient's age: Cancer Screenings, Depression Screening, Fall Risk Screening, Immunizations)    No results found for: LABA1C, LABMICR, LDLCHOLESTEROL, LDLCALC, AST, ALT, BUN   (goal A1C is < 7)   (goal LDL is <100) need 30-50% reduction from baseline     BP Readings from Last 3 Encounters:   09/23/18 (!) 155/98   11/04/17 (!) 157/94   08/18/17 (!) 169/106    (goal BP 120/80)      All Future Testing planned in [facility]:  Lab Frequency Next Occurrence   Comprehensive Metabolic Panel Once 12/31/2018       Next Visit Date:  Future Appointments   Date Time Provider Department Center   12/30/2018  9:15 AM Curt Bears, APRN - CNP [email or web address]   03/10/2019  9:00 AM Dayna Ramus, MD Resp Spec [email or web address]            Patient Active Problem List:     Hypothyroidism     Hypertension

## 2018-12-30 ENCOUNTER — Encounter: Attending: Family | Primary: Family

## 2018-12-30 NOTE — Telephone Encounter (Signed)
Placed call to patient and left voicemail to return call to the office regarding refill request.

## 2019-01-07 NOTE — Telephone Encounter (Signed)
Placed call to patient and left message to followup on status of seeing Rheumatologist

## 2019-01-12 ENCOUNTER — Encounter: Attending: Family | Primary: Family

## 2019-01-29 ENCOUNTER — Encounter: Attending: Pulmonary Disease | Primary: Family

## 2019-03-10 ENCOUNTER — Encounter: Attending: Pulmonary Disease | Primary: Family

## 2023-01-20 ENCOUNTER — Encounter: Payer: PRIVATE HEALTH INSURANCE | Attending: Family | Primary: Family

## 2023-01-22 ENCOUNTER — Encounter: Payer: PRIVATE HEALTH INSURANCE | Attending: Family | Primary: Family

## 2023-01-30 ENCOUNTER — Encounter: Payer: PRIVATE HEALTH INSURANCE | Attending: Family | Primary: Family

## 2023-02-05 ENCOUNTER — Encounter: Payer: PRIVATE HEALTH INSURANCE | Attending: Family | Primary: Family

## 2023-02-26 ENCOUNTER — Ambulatory Visit
Admit: 2023-02-26 | Discharge: 2023-02-26 | Payer: PRIVATE HEALTH INSURANCE | Attending: Family | Primary: Family Medicine

## 2023-02-26 ENCOUNTER — Inpatient Hospital Stay: Payer: PRIVATE HEALTH INSURANCE | Primary: Family Medicine

## 2023-02-26 ENCOUNTER — Telehealth

## 2023-02-26 VITALS — BP 187/112 | HR 102 | Ht 62.0 in | Wt 350.0 lb

## 2023-02-26 DIAGNOSIS — N939 Abnormal uterine and vaginal bleeding, unspecified: Secondary | ICD-10-CM

## 2023-02-26 DIAGNOSIS — I10 Essential (primary) hypertension: Principal | ICD-10-CM

## 2023-02-26 LAB — COMPREHENSIVE METABOLIC PANEL
ALT: 12 U/L (ref 10–35)
AST: 25 U/L (ref 10–35)
Albumin/Globulin Ratio: 1 (ref 1.0–2.5)
Albumin: 4 g/dL (ref 3.5–5.2)
Alkaline Phosphatase: 74 U/L (ref 35–104)
Anion Gap: 10 mmol/L (ref 9–16)
BUN: 18 mg/dL (ref 6–20)
CO2: 25 mmol/L (ref 20–31)
Calcium: 8.9 mg/dL (ref 8.6–10.4)
Chloride: 104 mmol/L (ref 98–107)
Creatinine: 0.9 mg/dL (ref 0.50–0.90)
Est, Glom Filt Rate: 88 mL/min/{1.73_m2} (ref >60)
Glucose: 94 mg/dL (ref 74–99)
Potassium: 3.9 mmol/L (ref 3.7–5.3)
Sodium: 139 mmol/L (ref 136–145)
Total Bilirubin: 0.2 mg/dL (ref 0.00–1.20)
Total Protein: 7.2 g/dL (ref 6.6–8.7)

## 2023-02-26 LAB — CBC WITH AUTO DIFFERENTIAL
Basophils %: 0 % (ref 0–2)
Basophils Absolute: 0 10*3/uL (ref 0.00–0.20)
Eosinophils %: 3 % (ref 1–4)
Eosinophils Absolute: 0.26 10*3/uL (ref 0.00–0.44)
Hematocrit: 27.3 % — ABNORMAL LOW (ref 36.3–47.1)
Hemoglobin: 7 g/dL — CL (ref 11.9–15.1)
Immature Granulocytes %: 1 % — ABNORMAL HIGH
Immature Granulocytes Absolute: 0.09 10*3/uL (ref 0.00–0.30)
Lymphocytes %: 20 % — ABNORMAL LOW (ref 24–43)
Lymphocytes Absolute: 1.76 10*3/uL (ref 1.10–3.70)
MCH: 16.7 pg — ABNORMAL LOW (ref 25.2–33.5)
MCHC: 25.6 g/dL — ABNORMAL LOW (ref 28.4–34.8)
MCV: 65 fL — ABNORMAL LOW (ref 82.6–102.9)
Monocytes %: 9 % (ref 3–12)
Monocytes Absolute: 0.79 10*3/uL (ref 0.10–1.20)
NRBC Automated: 0 per 100 WBC
Neutrophils %: 67 % — ABNORMAL HIGH (ref 36–65)
Neutrophils Absolute: 5.9 10*3/uL (ref 1.50–8.10)
Platelet, Fluorescence: 254 10*3/uL (ref 138–453)
Platelet, Immature Fraction: 3.8 % (ref 1.1–10.3)
RBC: 4.2 m/uL (ref 3.95–5.11)
RDW: 19.4 % — ABNORMAL HIGH (ref 11.8–14.4)
WBC: 8.8 10*3/uL (ref 3.5–11.3)

## 2023-02-26 LAB — FERRITIN: Ferritin: 8 ng/mL — ABNORMAL LOW (ref 13–150)

## 2023-02-26 LAB — VITAMIN D 25 HYDROXY: Vit D, 25-Hydroxy: 6.1 ng/mL — ABNORMAL LOW (ref 30.0–100.0)

## 2023-02-26 LAB — TSH REFLEX TO FT4: TSH: 2.8 u[IU]/mL (ref 0.27–4.20)

## 2023-02-26 MED ORDER — FERROUS SULFATE 325 (65 FE) MG PO TABS
325 | ORAL_TABLET | Freq: Two times a day (BID) | ORAL | 5 refills | Status: AC
Start: 2023-02-26 — End: ?

## 2023-02-26 MED ORDER — VITAMIN D (ERGOCALCIFEROL) 1.25 MG (50000 UT) PO CAPS
1.2550000 MG (50000 UT) | ORAL_CAPSULE | ORAL | 1 refills | Status: DC
Start: 2023-02-26 — End: 2023-10-22

## 2023-02-26 MED ORDER — LOSARTAN POTASSIUM 50 MG PO TABS
50 MG | ORAL_TABLET | Freq: Every day | ORAL | 1 refills | Status: DC
Start: 2023-02-26 — End: 2023-09-26

## 2023-02-26 NOTE — Progress Notes (Signed)
[facility]  [facility]  [address]  [facility]ARE [facility] [address] [place] [address]  Dept: [phone]  Dept Fax: [phone]    Crystal Wheeler (DOB:  1990-12-17) is a 32 y.o. female,New patient, here for evaluation of the following chief complaint(s):  Follow-up (Routine well check ) and Menstrual Problem    Crystal Wheeler is a 32 year old female here today to reestablish care, last seen in office on 09/13/2018.  History of sleep apnea, hypothyroidism, and hypertension.  Previously on chlorthalidone for blood pressure.  Was previously established with rheumatology at [facility]      Assessment & Plan   ASSESSMENT/PLAN:  1. Primary hypertension  -     Comprehensive Metabolic Panel; Future  -     losartan (COZAAR) 50 MG tablet; Take 1 tablet by mouth daily, Disp-90 tablet, R-1Normal  2. Need for vaccination  -     Tdap, BOOSTRIX, (age 55 yrs+), IM  3. Abnormal vaginal bleeding  -     Vaginitis DNA Probe; Future  -     Chlamydia/GC DNA, Urine; Future  -     TSH With Reflex Ft4; Future  -     CBC with Auto Differential; Future  -     Ferritin; Future  -     Vitamin D 25 Hydroxy; Future  4. OSA (obstructive sleep apnea)  5. Pica  -     CBC with Auto Differential; Future  -     Ferritin; Future  6. Morbid obesity with BMI of 60.0-69.9, adult (HCC)  -     Vitamin D 25 Hydroxy; Future    This is a 32 year old obese female here today to reestablish care, wishes to restart treatment for hypertension.  Will add losartan 50 mg today, consider restarting chlorthalidone at next visit.  History of moderate sleep apnea, has never had a CPAP with last PSG testing in 2019.  At this time she does not wish to repeat PSG testing and wishes to purchase a dental appliance for treatment.  Patient reports heavy menstrual periods with intermenstrual bleeding, passing clots.  Labs as above to evaluate given pica symptoms.  Patient agreeable to tetanus booster today  Close follow-up in 3 to 4  weeks, discussed echocardiogram if palpitations persist given history of untreated OSA.       Return in about 4 weeks (around 03/26/2023) for HTN.         Subjective   SUBJECTIVE/OBJECTIVE:  Admits she's put her health on the back burner, had been homeless at last visit without a stable living situation     HX of heavy cycles also has missed 2-3 cycles within a year. Now bleeding between periods. Passing large clots, feels she's craving dirt and corn starch        Review of Systems   Constitutional:  Negative for appetite change, fever and unexpected weight change.   HENT:  Positive for facial swelling. Negative for hearing loss and trouble swallowing.    Eyes:  Negative for visual disturbance.   Respiratory:  Negative for shortness of breath and wheezing.    Cardiovascular:  Positive for palpitations. Negative for chest pain.   Gastrointestinal:  Negative for abdominal pain, blood in stool, diarrhea and vomiting.   Endocrine: Negative for polydipsia and polyuria.   Genitourinary:  Negative for frequency and hematuria.   Musculoskeletal:  Negative for myalgias and neck pain.   Neurological:  Negative for seizures, numbness and headaches.  Psychiatric/Behavioral:  Negative for suicidal ideas. The patient is not nervous/anxious.           Objective     DIAGNOSTIC FINDINGS:  CBC:No results found for: "WBC", "HGB", "PLT"    BMP:  No results found for: "NA", "K", "CL", "CO2", "BUN", "CREATININE", "GLUCOSE"    HEMOGLOBIN A1C: No results found for: "LABA1C"    FASTING LIPID PANEL:No results found for: "CHOL", "HDL", "TRIG"    Physical Exam  Vitals and nursing note reviewed.   Constitutional:       General: She is not in acute distress.     Appearance: She is well-developed. She is obese.   HENT:      Head: Normocephalic.      Mouth/Throat:      Mouth: Mucous membranes are moist.   Eyes:      General: No scleral icterus.     Pupils: Pupils are equal, round, and reactive to light.   Neck:      Comments:  acanthosis  Cardiovascular:      Rate and Rhythm: Normal rate and regular rhythm.   Pulmonary:      Effort: Pulmonary effort is normal.      Breath sounds: Normal breath sounds.   Abdominal:      Palpations: Abdomen is soft.   Musculoskeletal:      Cervical back: Normal range of motion and neck supple.   Skin:     General: Skin is warm and dry.   Neurological:      Mental Status: She is alert and oriented to person, place, and time.      Coordination: Coordination normal.   Psychiatric:         Behavior: Behavior normal. Behavior is cooperative.         Thought Content: Thought content normal.         Judgment: Judgment normal.            An electronic signature was used to authenticate this note.    --Curt Bears, APRN - CNP

## 2023-02-26 NOTE — Telephone Encounter (Signed)
I called and spoke with the patient this afternoon, advised her vitamin D and hemoglobin levels.  Patient states that she is not experiencing dizziness or shortness of breath in regards to her anemia.  We agreed to start twice daily oral iron and that she is to report to the emergency room should she have any symptoms or indications of worsening anemia.  Urgent referral placed to gynecology given symptomatic anemia secondary to vaginal bleeding as patient is euthyroid.  Pelvic and transvaginal ultrasound ordered.  The patient was also advised regarding vitamin D deficiency, to start once weekly high-dose vitamin D replacement.  Instructed to avoid dairy products within 2 hours of ingesting iron.

## 2023-02-27 ENCOUNTER — Encounter

## 2023-02-27 ENCOUNTER — Inpatient Hospital Stay: Payer: PRIVATE HEALTH INSURANCE | Primary: Family Medicine

## 2023-02-27 LAB — VAGINITIS DNA PROBE
Candida species: NEGATIVE
GARDNERELLA VAGINALIS: POSITIVE — AB
Trichomonas: NEGATIVE

## 2023-02-27 LAB — C.TRACHOMATIS N.GONORRHOEAE DNA, URINE
C. trachomatis DNA ,Urine: POSITIVE — AB
N. gonorrhoeae DNA, Urine: NEGATIVE

## 2023-02-27 MED ORDER — METRONIDAZOLE 500 MG PO TABS
500 | ORAL_TABLET | Freq: Two times a day (BID) | ORAL | 0 refills | Status: AC
Start: 2023-02-27 — End: 2023-03-06

## 2023-02-27 MED ORDER — DOXYCYCLINE HYCLATE 100 MG PO TABS
100 | ORAL_TABLET | Freq: Two times a day (BID) | ORAL | 0 refills | Status: AC
Start: 2023-02-27 — End: 2023-03-06

## 2023-03-10 ENCOUNTER — Ambulatory Visit: Payer: PRIVATE HEALTH INSURANCE | Primary: Family

## 2023-03-19 ENCOUNTER — Ambulatory Visit: Payer: PRIVATE HEALTH INSURANCE | Primary: Family

## 2023-03-24 ENCOUNTER — Ambulatory Visit: Payer: PRIVATE HEALTH INSURANCE | Primary: Family

## 2023-03-26 ENCOUNTER — Encounter: Payer: PRIVATE HEALTH INSURANCE | Attending: Family | Primary: Family

## 2023-03-27 ENCOUNTER — Inpatient Hospital Stay: Admit: 2023-03-27 | Discharge: 2023-03-27 | Payer: PRIVATE HEALTH INSURANCE | Primary: Family

## 2023-03-27 DIAGNOSIS — N939 Abnormal uterine and vaginal bleeding, unspecified: Secondary | ICD-10-CM

## 2023-04-10 ENCOUNTER — Encounter: Payer: PRIVATE HEALTH INSURANCE | Attending: Family | Primary: Family

## 2023-05-09 ENCOUNTER — Encounter

## 2023-05-09 MED ORDER — FERROUS SULFATE 325 (65 FE) MG PO TABS
32565 | ORAL_TABLET | Freq: Two times a day (BID) | ORAL | 5 refills | Status: DC
Start: 2023-05-09 — End: 2023-10-22

## 2023-05-09 NOTE — Telephone Encounter (Signed)
 LOV  02/26/23

## 2023-06-19 ENCOUNTER — Encounter
Payer: PRIVATE HEALTH INSURANCE | Attending: Student in an Organized Health Care Education/Training Program | Primary: Family

## 2023-06-24 ENCOUNTER — Encounter: Payer: PRIVATE HEALTH INSURANCE | Attending: Family | Primary: Family

## 2023-07-14 ENCOUNTER — Encounter: Payer: PRIVATE HEALTH INSURANCE | Attending: Family | Primary: Family

## 2023-09-01 ENCOUNTER — Encounter: Admit: 2023-09-01 | Admitting: Family

## 2023-09-01 DIAGNOSIS — I10 Essential (primary) hypertension: Secondary | ICD-10-CM

## 2023-09-02 ENCOUNTER — Encounter: Admit: 2023-09-02 | Admitting: Family

## 2023-09-02 DIAGNOSIS — I10 Essential (primary) hypertension: Secondary | ICD-10-CM

## 2023-09-10 NOTE — Telephone Encounter (Signed)
 error

## 2023-09-13 ENCOUNTER — Encounter

## 2023-09-26 ENCOUNTER — Ambulatory Visit
Admit: 2023-09-26 | Discharge: 2023-09-26 | Payer: PRIVATE HEALTH INSURANCE | Attending: Family Medicine | Primary: Family Medicine

## 2023-09-26 VITALS — BP 172/122 | HR 97 | Ht 62.0 in | Wt 346.0 lb

## 2023-09-26 DIAGNOSIS — I10 Essential (primary) hypertension: Secondary | ICD-10-CM

## 2023-09-26 MED ORDER — LOSARTAN POTASSIUM 100 MG PO TABS
100 MG | ORAL_TABLET | Freq: Every day | ORAL | 1 refills | Status: DC
Start: 2023-09-26 — End: 2023-10-22

## 2023-09-26 MED ORDER — OMEPRAZOLE 20 MG PO CPDR
20 MG | ORAL_CAPSULE | Freq: Every day | ORAL | 2 refills | Status: DC
Start: 2023-09-26 — End: 2023-10-22

## 2023-09-26 NOTE — Progress Notes (Signed)
 09/26/2023    Crystal Wheeler (DOB:  [birth year]) is a 33 y.o. female, here to establish care.    She was previously following with another [facility] office, however was having some transportation issues. This clinic is much closer for her.     She will be seeing gynecology next month for adenomyosis, uterine fibroid    She tells me that she has had a swelling or mass to the left side of her neck, under her jaw that seems to be increasing in size. It is not causing any pain, however it is starting to cause her concern.    She has a history of HTN, previously on chlorthalidone, losartan, hasn't been taking her medications consistently. She did take her losartan about 1 hr ago. She does not like to take chlorthalidone due to it making her urinate a lot.    She reports symptoms of reflux, taking famotidine without great relief.     She has a history of anemia, uterine fibroid, potential adenomyosis, taking ferrous sulfate BID. She tells me that she was offered a transfusion after last hemoglobin was 7, however she declined.     She has a history of hypothyroidism  She has a history of sleep apnea  She has a history of Vitamin D deficiency      Review of Systems  Except as noted in the HPI, a 10 point ROS was negative    Prior to Visit Medications    Medication Sig Taking? Authorizing Provider   ferrous sulfate (IRON 325) 325 (65 Fe) MG tablet Take 1 tablet by mouth 2 times daily (before meals) Yes Curt Bears, APRN - CNP   losartan (COZAAR) 50 MG tablet Take 1 tablet by mouth daily Yes Curt Bears, APRN - CNP   vitamin D (ERGOCALCIFEROL) 1.25 MG (50000 UT) CAPS capsule Take 1 capsule by mouth once a week Yes Curt Bears, APRN - CNP   famotidine (PEPCID) 20 MG tablet Take 1 tablet by mouth 2 times daily Yes Curt Bears, APRN - CNP   chlorthalidone (HYGROTEN) 50 MG tablet Take 1 tablet by mouth daily  Patient not taking: Reported on 02/26/2023  Curt Bears, APRN - CNP        No Known Allergies    Past  Medical History:   Diagnosis Date    Allergic rhinitis     Anemia     Headache     Hypertension     Hypothyroidism     Inflammatory polyarthritis (HCC) 2019    Obesity     Sleep apnea     Uterine fibroid     Vitamin D deficiency        Past Surgical History:   Procedure Laterality Date    CESAREAN SECTION      TONSILLECTOMY AND ADENOIDECTOMY       Family History   Problem Relation Age of Onset    Anemia Mother     High Blood Pressure Mother     Miscarriages / Stillbirths Mother     Diabetes Father     High Blood Pressure Father     Heart Disease Maternal Aunt     Alcohol Abuse Maternal Uncle     Allergy (Severe) Maternal Cousin     Allergy (Severe) Paternal Cousin            Vitals:    09/26/23 1142 09/26/23 1225   BP: (!) 194/127 (!) 172/122   Pulse: 97    SpO2:  99%    Weight: (!) 156.9 kg (346 lb)    Height: 1.575 m (5\' 2" )      Estimated body mass index is 63.28 kg/m as calculated from the following:    Height as of this encounter: 1.575 m (5\' 2" ).    Weight as of this encounter: 156.9 kg (346 lb).       Objective   Physical Exam  Vitals reviewed.   Constitutional:       General: She is not in acute distress.     Appearance: She is well-developed.   HENT:      Head: Normocephalic and atraumatic.      Mouth/Throat:      Mouth: Mucous membranes are moist.      Pharynx: Oropharynx is clear.   Eyes:      Conjunctiva/sclera: Conjunctivae normal.   Cardiovascular:      Rate and Rhythm: Normal rate and regular rhythm.      Heart sounds: Normal heart sounds. No murmur heard.  Pulmonary:      Effort: Pulmonary effort is normal.      Breath sounds: Normal breath sounds. No wheezing.   Lymphadenopathy:      Cervical: Cervical adenopathy present.   Skin:     General: Skin is warm and dry.   Neurological:      General: No focal deficit present.      Mental Status: She is alert and oriented to person, place, and time.      Motor: No abnormal muscle tone.      Coordination: Coordination normal.   Psychiatric:         Mood and  Affect: Mood normal.         Behavior: Behavior normal.         Thought Content: Thought content normal.         Judgment: Judgment normal.             Latest Ref Rng & Units 02/26/2023    11:44 AM   [facility]   GLU random 74 - 99 mg/dL 94    NA 213 - 086 mmol/L 139    K 3.7 - 5.3 mmol/L 3.9    BUN 6 - 20 mg/dL 18    CR 5.78 - 4.69 mg/dL 0.9    GFR >62 XB/MWU/1.32G4 88    CA 8.6 - 10.4 mg/dL 8.9    ALT 10 - 35 U/L 12    AST 10 - 35 U/L 25    TSH 0.27 - 4.20 uIU/mL 2.80    HGB 11.9 - 15.1 g/dL 7.0        Lab Results   Component Value Date/Time    GLUCOSE 94 02/26/2023 11:44 AM         Assessment & Plan  Primary hypertension   Above goal of <140/90. We'll increase her losartan to 100 mg. We discussed that we'd likely need to add another agent to this, however we'll bring her back in a few weeks for recheck first. CMP ordered. BP cuff ordered and sent to insurance.     Orders:    losartan (COZAAR) 100 MG tablet; Take 1 tablet by mouth daily    Comprehensive Metabolic Panel; Future    Blood Pressure KIT; 1 each by Does not apply route daily as needed (Daily)    Gastroesophageal reflux disease without esophagitis   Trial of omeprazole. We discussed if symptoms continue we should consider referral to GI due to previously  failing famotidine as well.     Orders:    omeprazole (PRILOSEC) 20 MG delayed release capsule; Take 1 capsule by mouth every morning (before breakfast)    Neck mass     Orders:    [place] HEAD NECK SOFT TISSUE; Future    Hypothyroidism, unspecified type   Not currently on levothyroxine    Orders:    TSH; Future    T4, Free; Future    Iron deficiency anemia, unspecified iron deficiency anemia type     Orders:    CBC with Auto Differential; Future    Ferritin; Future    Screening for lipid disorders     Orders:    Lipid Panel; Future    Vitamin D deficiency     Orders:    Vitamin D 25 Hydroxy; Future      Return in about 2 weeks (around 10/10/2023).           --Cora Daniels, DO

## 2023-09-28 NOTE — Assessment & Plan Note (Addendum)
Not currently on levothyroxine    Orders:    TSH; Future    T4, Free; Future

## 2023-09-28 NOTE — Assessment & Plan Note (Addendum)
Above goal of <140/90. We'll increase her losartan to 100 mg. We discussed that we'd likely need to add another agent to this, however we'll bring her back in a few weeks for recheck first. CMP ordered. BP cuff ordered and sent to insurance.     Orders:    losartan (COZAAR) 100 MG tablet; Take 1 tablet by mouth daily    Comprehensive Metabolic Panel; Future    Blood Pressure KIT; 1 each by Does not apply route daily as needed (Daily)

## 2023-09-29 MED ORDER — BLOOD PRESSURE KIT
PACK | Freq: Every day | 0 refills | Status: AC | PRN
Start: 2023-09-29 — End: ?

## 2023-09-29 NOTE — Addendum Note (Signed)
Addended by: Cora Daniels on: 09/29/2023 03:56 PM     Modules accepted: Orders

## 2023-10-13 ENCOUNTER — Inpatient Hospital Stay
Admit: 2023-10-13 | Discharge: 2023-10-13 | Payer: PRIVATE HEALTH INSURANCE | Attending: Family Medicine | Primary: Family Medicine

## 2023-10-13 DIAGNOSIS — R221 Localized swelling, mass and lump, neck: Secondary | ICD-10-CM

## 2023-10-21 ENCOUNTER — Inpatient Hospital Stay: Payer: PRIVATE HEALTH INSURANCE | Primary: Family Medicine

## 2023-10-21 DIAGNOSIS — Z13220 Encounter for screening for lipoid disorders: Secondary | ICD-10-CM

## 2023-10-21 LAB — COMPREHENSIVE METABOLIC PANEL
ALT: 13 U/L (ref 10–35)
AST: 19 U/L (ref 10–35)
Albumin/Globulin Ratio: 1.1 (ref 1.0–2.5)
Albumin: 3.8 g/dL (ref 3.5–5.2)
Alkaline Phosphatase: 73 U/L (ref 35–104)
Anion Gap: 10 mmol/L (ref 9–16)
BUN: 15 mg/dL (ref 6–20)
CO2: 27 mmol/L (ref 20–31)
Calcium: 9 mg/dL (ref 8.6–10.4)
Chloride: 103 mmol/L (ref 98–107)
Creatinine: 1 mg/dL — ABNORMAL HIGH (ref 0.6–0.9)
Est, Glom Filt Rate: 76 mL/min/{1.73_m2} (ref >60)
Glucose: 80 mg/dL (ref 74–99)
Potassium: 4.1 mmol/L (ref 3.7–5.3)
Sodium: 140 mmol/L (ref 136–145)
Total Bilirubin: 0.4 mg/dL (ref 0.0–1.2)
Total Protein: 7.4 g/dL (ref 6.6–8.7)

## 2023-10-21 LAB — CBC WITH AUTO DIFFERENTIAL
Basophils %: 0 % (ref 0–2)
Basophils Absolute: <0.03 10*3/uL (ref 0.00–0.20)
Eosinophils %: 3 % (ref 1–4)
Eosinophils Absolute: 0.25 10*3/uL (ref 0.00–0.44)
Hematocrit: 38.4 % (ref 36.3–47.1)
Hemoglobin: 10.9 g/dL — ABNORMAL LOW (ref 11.9–15.1)
Immature Granulocytes %: 1 % — ABNORMAL HIGH
Immature Granulocytes Absolute: 0.05 10*3/uL (ref 0.00–0.30)
Lymphocytes %: 17 % — ABNORMAL LOW (ref 24–43)
Lymphocytes Absolute: 1.34 10*3/uL (ref 1.10–3.70)
MCH: 22.9 pg — ABNORMAL LOW (ref 25.2–33.5)
MCHC: 28.4 g/dL (ref 28.4–34.8)
MCV: 80.8 fL — ABNORMAL LOW (ref 82.6–102.9)
MPV: 11 fL (ref 8.1–13.5)
Monocytes %: 9 % (ref 3–12)
Monocytes Absolute: 0.68 10*3/uL (ref 0.10–1.20)
NRBC Automated: 0 /100{WBCs}
Neutrophils %: 70 % — ABNORMAL HIGH (ref 36–65)
Neutrophils Absolute: 5.38 10*3/uL (ref 1.50–8.10)
Platelets: 259 10*3/uL (ref 138–453)
RBC: 4.75 m/uL (ref 3.95–5.11)
RDW: 16.5 % — ABNORMAL HIGH (ref 11.8–14.4)
WBC: 7.7 10*3/uL (ref 3.5–11.3)

## 2023-10-21 LAB — FERRITIN: Ferritin: 21 ng/mL (ref 15–150)

## 2023-10-21 LAB — TSH: TSH: 2.49 u[IU]/mL (ref 0.27–4.20)

## 2023-10-21 LAB — LIPID PANEL
Chol/HDL Ratio: 2.4
Cholesterol, Total: 143 mg/dL (ref 0–199)
HDL: 59 mg/dL (ref >40)
LDL Cholesterol: 74 mg/dL (ref 0–100)
Triglycerides: 50 mg/dL (ref <150)
VLDL: 10 mg/dL (ref 1–30)

## 2023-10-21 LAB — T4, FREE: T4 Free: 0.9 ng/dL (ref 0.9–1.7)

## 2023-10-21 LAB — VITAMIN D 25 HYDROXY: Vit D, 25-Hydroxy: 18 ng/mL — ABNORMAL LOW (ref 30.0–100.0)

## 2023-10-22 ENCOUNTER — Ambulatory Visit
Admit: 2023-10-22 | Discharge: 2023-10-22 | Payer: PRIVATE HEALTH INSURANCE | Attending: Family Medicine | Primary: Family Medicine

## 2023-10-22 DIAGNOSIS — I10 Essential (primary) hypertension: Secondary | ICD-10-CM

## 2023-10-22 MED ORDER — FERROUS SULFATE 325 (65 FE) MG PO TABS
32565 | ORAL_TABLET | Freq: Two times a day (BID) | ORAL | 3 refills | Status: AC
Start: 2023-10-22 — End: 2024-10-21

## 2023-10-22 MED ORDER — VITAMIN D (ERGOCALCIFEROL) 1.25 MG (50000 UT) PO CAPS
1.2550000 | ORAL_CAPSULE | ORAL | 1 refills | Status: AC
Start: 2023-10-22 — End: ?

## 2023-10-22 MED ORDER — AMLODIPINE BESYLATE 5 MG PO TABS
5 | ORAL_TABLET | Freq: Every day | ORAL | 3 refills | Status: AC
Start: 2023-10-22 — End: ?

## 2023-10-22 MED ORDER — OMEPRAZOLE 20 MG PO CPDR
20 | ORAL_CAPSULE | Freq: Every day | ORAL | 3 refills | Status: AC
Start: 2023-10-22 — End: ?

## 2023-10-22 MED ORDER — LOSARTAN POTASSIUM 100 MG PO TABS
100 MG | ORAL_TABLET | Freq: Every day | ORAL | 3 refills | Status: DC
Start: 2023-10-22 — End: 2023-12-02

## 2023-10-22 NOTE — Progress Notes (Signed)
 10/22/2023    Crystal Wheeler (DOB:  [birth year]) is a 33 y.o. female, here to follow up on HTN, GERD    She was last seen about 3-4 weeks ago. We increased her losartan to 100 mg due to elevated blood pressure. BP cuff was ordered. She tells me the cuff she received wasn't the right size however.     We started her on omeprazole due to symptoms of GERD. We discussed if no improvement or minimal improvement after a trial we will refer to GI.     Thyroid ultrasound was read as normal. Hemoglobin, iron improved, Vitamin D still low. She tells me that she doesn't feel she hasn't been taking her supplements consistently.       She has a history of HTN, previously on chlorthalidone which made her urinate a lot  History of GERD, now on omeprazole  She has a history of anemia, uterine fibroid, potential adenomyosis, taking ferrous sulfate BID. She tells me that she was offered a transfusion after last hemoglobin was 7, however she declined.   She has a history of hypothyroidism  She has a history of sleep apnea  She has a history of Vitamin D deficiency      Review of Systems  Except as noted in the HPI, a 10 point ROS was negative    Prior to Visit Medications    Medication Sig Taking? Authorizing Provider   Blood Pressure KIT 1 each by Does not apply route daily as needed (Daily)  Najee Manninen, Belenda Cruise, DO   losartan (COZAAR) 100 MG tablet Take 1 tablet by mouth daily  Aarit Kashuba, DO   omeprazole (PRILOSEC) 20 MG delayed release capsule Take 1 capsule by mouth every morning (before breakfast)  Corneluis Allston, Belenda Cruise, DO   ferrous sulfate (IRON 325) 325 (65 Fe) MG tablet Take 1 tablet by mouth 2 times daily (before meals)  Shon Baton, Manley Mason, APRN - CNP   vitamin D (ERGOCALCIFEROL) 1.25 MG (50000 UT) CAPS capsule Take 1 capsule by mouth once a week  Curt Bears, APRN - CNP        No Known Allergies    Past Medical History:   Diagnosis Date    Allergic rhinitis     Anemia     Headache     Hypertension     Hypothyroidism      Inflammatory polyarthritis (HCC) 2019    Obesity     Sleep apnea     Uterine fibroid     Vitamin D deficiency        Past Surgical History:   Procedure Laterality Date    CESAREAN SECTION      TONSILLECTOMY AND ADENOIDECTOMY       Family History   Problem Relation Age of Onset    Anemia Mother     High Blood Pressure Mother     Miscarriages / Stillbirths Mother     Diabetes Father     High Blood Pressure Father     Heart Disease Maternal Aunt     Alcohol Abuse Maternal Uncle     Allergy (Severe) Maternal Cousin     Allergy (Severe) Paternal Cousin            There were no vitals filed for this visit.    Estimated body mass index is 63.28 kg/m as calculated from the following:    Height as of 09/26/23: 1.575 m (5\' 2" ).    Weight as of 09/26/23: 156.9 kg (346 lb).  Objective   Physical Exam  Vitals reviewed.   Constitutional:       General: She is not in acute distress.     Appearance: She is well-developed.   HENT:      Head: Normocephalic and atraumatic.      Mouth/Throat:      Mouth: Mucous membranes are moist.      Pharynx: Oropharynx is clear.   Eyes:      Conjunctiva/sclera: Conjunctivae normal.   Cardiovascular:      Rate and Rhythm: Normal rate and regular rhythm.      Heart sounds: Normal heart sounds. No murmur heard.  Pulmonary:      Effort: Pulmonary effort is normal.      Breath sounds: Normal breath sounds. No wheezing.   Skin:     General: Skin is warm and dry.   Neurological:      General: No focal deficit present.      Mental Status: She is alert and oriented to person, place, and time.      Motor: No abnormal muscle tone.      Coordination: Coordination normal.   Psychiatric:         Mood and Affect: Mood normal.         Behavior: Behavior normal.         Thought Content: Thought content normal.         Judgment: Judgment normal.             Latest Ref Rng & Units 10/21/2023     9:35 AM 02/26/2023    11:44 AM   [facility]   GLU random 74 - 99 mg/dL 80  94    CHOL 0 - 161 mg/dL 096     TRIG  <045 mg/dL 50     HDL >40 mg/dL 59     LDL CALC 0 - 981 mg/dL 74     NA 191 - 478 mmol/L 140  139    K 3.7 - 5.3 mmol/L 4.1  3.9    BUN 6 - 20 mg/dL 15  18    CR 0.6 - 0.9 mg/dL 1.0  0.9    GFR >29 FA/OZH/0.86V7 76  88    CA 8.6 - 10.4 mg/dL 9.0  8.9    ALT 10 - 35 U/L 13  12    AST 10 - 35 U/L 19  25    TSH 0.27 - 4.20 uIU/mL 2.49  2.80    HGB 11.9 - 15.1 g/dL 84.6  7.0        Lab Results   Component Value Date/Time    CHOL 143 10/21/2023 09:35 AM    TRIG 50 10/21/2023 09:35 AM    HDL 59 10/21/2023 09:35 AM    GLUCOSE 80 10/21/2023 09:35 AM         Assessment & Plan  Primary hypertension   Much improved, but still above goal of <140/90. We'll add amlodipine 5 mg daily. Discussed watching out for LE edema    Orders:    losartan (COZAAR) 100 MG tablet; Take 1 tablet by mouth daily    amLODIPine (NORVASC) 5 MG tablet; Take 1 tablet by mouth daily    Gastroesophageal reflux disease without esophagitis   Much improved with PRN use of omeprazole. Will monitor for return or worsening symptoms, and refer to GI in that case    Orders:    omeprazole (PRILOSEC) 20 MG delayed release capsule; Take 1 capsule by  mouth every morning (before breakfast)    Vitamin D deficiency   She reports she will take her Vitamin D more consistently rather than increasing dose    Orders:    vitamin D (ERGOCALCIFEROL) 1.25 MG (50000 UT) CAPS capsule; Take 1 capsule by mouth once a week    Abnormal vaginal bleeding   Evaluation with GYN pending    Orders:    ferrous sulfate (IRON 325) 325 (65 Fe) MG tablet; Take 1 tablet by mouth 2 times daily (before meals)    Other iron deficiency anemia   She will work on taking her iron pill more consistently before increasing    Orders:    ferrous sulfate (IRON 325) 325 (65 Fe) MG tablet; Take 1 tablet by mouth 2 times daily (before meals)      Return in about 1 month (around 11/19/2023).           --Cora Daniels, DO

## 2023-10-26 NOTE — Assessment & Plan Note (Addendum)
 Much improved, but still above goal of <140/90. We'll add amlodipine 5 mg daily. Discussed watching out for LE edema    Orders:    losartan (COZAAR) 100 MG tablet; Take 1 tablet by mouth daily    amLODIPine (NORVASC) 5 MG tablet; Take 1 tablet by mouth daily

## 2023-10-29 ENCOUNTER — Ambulatory Visit
Admit: 2023-10-29 | Discharge: 2023-10-29 | Payer: PRIVATE HEALTH INSURANCE | Attending: Student in an Organized Health Care Education/Training Program | Primary: Family Medicine

## 2023-10-29 VITALS — BP 188/128 | Wt 326.0 lb

## 2023-10-29 DIAGNOSIS — I10 Essential (primary) hypertension: Secondary | ICD-10-CM

## 2023-10-30 NOTE — Progress Notes (Signed)
 OB/GYN Problem Visit    Crystal Wheeler  10/29/2023                       Primary Care Physician: Cora Daniels, DO    CC:   Chief Complaint   Patient presents with    New Patient     she is here for AUB , heavy periods          HPI: Aayliah Grosshans is a 33 y.o. female G1P0    The patient was seen and examined. She is here today to discuss her very heavy uterine bleeding.  She passes blood clots and will soak through pads and close.  This has been very distressing to her and she is considering her management options.  Pelvic ultrasound in 03/22/2023 showed fibroids and also features that could be seen with adenomyosis.  Patient knows that she may need a hysterectomy in the future, but is she is ready to make that choice.  She would like to hear about her options today.    Of note, patient's blood pressure is extremely high.  Patient did not want to discuss her elevated blood pressure today.  Did recommend that if she became symptomatic or had signs or symptoms of end organ failure she need to present to the ER immediately.  Patient states she knows when her blood pressure is high she can feel the pounding in her ears.  She did become a little bit frustrated when discussing the blood pressure likely because it is a source of stress for her.    REVIEW OF SYSTEMS:   Constitutional: negative fever, negative chills   HEENT: negative visual disturbances, negative headaches  Respiratory: negative dyspnea, negative cough  Cardiovascular: negative chest pain,  negative palpitations  Gastrointestinal: negative abdominal pain, negative RUQ pain, negative N/V, negative diarrhea, negative constipation  Genitourinary: negative dysuria, positive heavy vaginal bleeding  Dermatological: negative rash  Hematologic: negative bruising  Immunologic/Lymphatic: negative recent illness, negative recent sick contact  Musculoskeletal: negative back pain, negative myalgias, negative arthralgias  Neurological:  negative dizziness, negative  weakness  Behavior/Psych: negative depression, negative anxiety      OBSTETRICAL HISTORY:  OB History   Gravida Para Term Preterm AB Living   1         1   SAB IAB Ectopic Molar Multiple Live Births                    # Outcome Date GA Lbr Len/2nd Weight Sex Type Anes PTL Lv   1 Gravida                PAST MEDICAL HISTORY:      Diagnosis Date    Allergic rhinitis     Anemia     Headache     Hypertension     Hypothyroidism     Inflammatory polyarthritis (HCC) 2019    Obesity     Sleep apnea     Uterine fibroid     Vitamin D deficiency        PAST SURGICAL HISTORY:                                                                    Procedure  Laterality Date    CESAREAN SECTION      TONSILLECTOMY AND ADENOIDECTOMY         MEDICATIONS:  Current Outpatient Medications   Medication Sig Dispense Refill    vitamin D (ERGOCALCIFEROL) 1.25 MG (50000 UT) CAPS capsule Take 1 capsule by mouth once a week 12 capsule 1    losartan (COZAAR) 100 MG tablet Take 1 tablet by mouth daily 90 tablet 3    amLODIPine (NORVASC) 5 MG tablet Take 1 tablet by mouth daily 90 tablet 3    ferrous sulfate (IRON 325) 325 (65 Fe) MG tablet Take 1 tablet by mouth 2 times daily (before meals) 180 tablet 3    omeprazole (PRILOSEC) 20 MG delayed release capsule Take 1 capsule by mouth every morning (before breakfast) 90 capsule 3    Blood Pressure KIT 1 each by Does not apply route daily as needed (Daily) 1 kit 0     No current facility-administered medications for this visit.       ALLERGIES:  Allergies as of 10/29/2023    (No Known Allergies)                                   VITALS:  Vitals:    10/29/23 1015 10/29/23 1049   BP: (!) 158/110 (!) 188/128   Site:  Left Upper Arm   Position:  Sitting   Cuff Size:  Large Adult   Weight: (!) 147.9 kg (326 lb)                                                                                                                                                                          PHYSICAL EXAM:   General  Appearance: Appears healthy.  Alert; in no acute distress.  Pleasant.  Skin: Normal and without lesions  Respiratory: normal chest wall rise bilaterally, no increased work of breathing  Cardiovascular: normal, regular rate and rhythm  Abdomen: soft, non-tender, non-distended, no right upper quadrant tenderness, and no CVA tenderness  Extremities: non-tender BLE and non-edematous  Musculoskeletal: no gross abnormalities  Psych: Normal. and Alert and oriented, appropriate affect.    DATA:  No results found for this visit on 10/29/23.    ASSESSMENT & PLAN:    Kenyetta Wimbish is a 33 y.o. female G1P0  -Discussed in detail with patient multiple options for bleeding control including both medical and surgical options  -Patient is not a great candidate for estrogen-containing contraceptives at this time given very uncontrolled blood pressure  -Patient leaning towards progesterone only options or lysteda  -She would like some more information on the topic and plans to think on what  she would like to do.  -Message sent to patient reviewing what was discussed today  -Counseled patient on the importance of blood pressure control.  And recommended short-term follow-up with her PCP.  Explained to patient that is normal for [place] to take vitals and to counsel on them appropriately.  Patient Active Problem List    Diagnosis Date Noted    Hypothyroidism 09/23/2018    Hypertension 09/23/2018       Patient was seen with total face to face time of 20 minutes. More than 50% of this visit was on counseling and education regarding her diagnose(s) as listed below and options. She was also counseled on her preventative health maintenance recommendations and follow-up.      Diagnosis Orders   1. Severe hypertension        2. Abnormal uterine bleeding            Dr. Jorge Mandril  Obstetrician Gynecologist  [facility]  10/30/2023, 10:37 PM

## 2023-11-19 ENCOUNTER — Encounter: Payer: PRIVATE HEALTH INSURANCE | Attending: Family Medicine | Primary: Family Medicine

## 2023-11-19 NOTE — Progress Notes (Unsigned)
 11/19/2023    Crystal Wheeler (DOB:  05-Jun-1991) is a 33 y.o. female, here to follow up on HTN, GERD    She was last seen about 3-4 weeks ago. We increased her losartan to 100 mg due to elevated blood pressure. BP cuff was ordered. She tells me the cuff she received wasn't the right size however.     We started her on omeprazole due to symptoms of GERD. We discussed if no improvement or minimal improvement after a trial we will refer to GI.     Thyroid ultrasound was read as normal. Hemoglobin, iron improved, Vitamin D still low. She tells me that she doesn't feel she hasn't been taking her supplements consistently.       She has a history of HTN, previously on chlorthalidone which made her urinate a lot  History of GERD, now on omeprazole  She has a history of anemia, uterine fibroid, potential adenomyosis, taking ferrous sulfate BID. She tells me that she was offered a transfusion after last hemoglobin was 7, however she declined.   She has a history of hypothyroidism  She has a history of sleep apnea  She has a history of Vitamin D deficiency      Review of Systems  Except as noted in the HPI, a 10 point ROS was negative    Prior to Visit Medications    Medication Sig Taking? Authorizing Provider   vitamin D (ERGOCALCIFEROL) 1.25 MG (50000 UT) CAPS capsule Take 1 capsule by mouth once a week  Desirre Eickhoff, DO   losartan (COZAAR) 100 MG tablet Take 1 tablet by mouth daily  Seabron Iannello, DO   amLODIPine (NORVASC) 5 MG tablet Take 1 tablet by mouth daily  Obie Silos, DO   ferrous sulfate (IRON 325) 325 (65 Fe) MG tablet Take 1 tablet by mouth 2 times daily (before meals)  Azure Budnick, Belenda Cruise, DO   omeprazole (PRILOSEC) 20 MG delayed release capsule Take 1 capsule by mouth every morning (before breakfast)  Sheala Dosh, DO   Blood Pressure KIT 1 each by Does not apply route daily as needed (Daily)  Cora Daniels, DO        No Known Allergies    Past Medical History:   Diagnosis Date     Allergic rhinitis     Anemia     Headache     Hypertension     Hypothyroidism     Inflammatory polyarthritis (HCC) 2019    Obesity     Sleep apnea     Uterine fibroid     Vitamin D deficiency        Past Surgical History:   Procedure Laterality Date    CESAREAN SECTION      TONSILLECTOMY AND ADENOIDECTOMY       Family History   Problem Relation Age of Onset    Anemia Mother     High Blood Pressure Mother     Miscarriages / Stillbirths Mother     Diabetes Father     High Blood Pressure Father     Heart Disease Maternal Aunt     Alcohol Abuse Maternal Uncle     Allergy (Severe) Maternal Cousin     Allergy (Severe) Paternal Cousin            There were no vitals filed for this visit.    Estimated body mass index is 59.63 kg/m as calculated from the following:    Height as of 10/22/23: 1.575 m (5\' 2" ).  Weight as of 10/29/23: 147.9 kg (326 lb).       Objective   Physical Exam  Vitals reviewed.   Constitutional:       General: She is not in acute distress.     Appearance: She is well-developed.   HENT:      Head: Normocephalic and atraumatic.      Mouth/Throat:      Mouth: Mucous membranes are moist.      Pharynx: Oropharynx is clear.   Eyes:      Conjunctiva/sclera: Conjunctivae normal.   Cardiovascular:      Rate and Rhythm: Normal rate and regular rhythm.      Heart sounds: Normal heart sounds. No murmur heard.  Pulmonary:      Effort: Pulmonary effort is normal.      Breath sounds: Normal breath sounds. No wheezing.   Skin:     General: Skin is warm and dry.   Neurological:      General: No focal deficit present.      Mental Status: She is alert and oriented to person, place, and time.      Motor: No abnormal muscle tone.      Coordination: Coordination normal.   Psychiatric:         Mood and Affect: Mood normal.         Behavior: Behavior normal.         Thought Content: Thought content normal.         Judgment: Judgment normal.             Latest Ref Rng & Units 10/21/2023     9:35 AM 02/26/2023    11:44 AM   LAB  PRIMARY CARE   GLU random 74 - 99 mg/dL 80  94    CHOL 0 - 401 mg/dL 027     TRIG <253 mg/dL 50     HDL >66 mg/dL 59     LDL CALC 0 - 440 mg/dL 74     NA 347 - 425 mmol/L 140  139    K 3.7 - 5.3 mmol/L 4.1  3.9    BUN 6 - 20 mg/dL 15  18    CR 0.6 - 0.9 mg/dL 1.0  0.9    GFR >95 GL/OVF/6.43P2 76  88    CA 8.6 - 10.4 mg/dL 9.0  8.9    ALT 10 - 35 U/L 13  12    AST 10 - 35 U/L 19  25    TSH 0.27 - 4.20 uIU/mL 2.49  2.80    HGB 11.9 - 15.1 g/dL 95.1  7.0        Lab Results   Component Value Date/Time    CHOL 143 10/21/2023 09:35 AM    TRIG 50 10/21/2023 09:35 AM    HDL 59 10/21/2023 09:35 AM    GLUCOSE 80 10/21/2023 09:35 AM         Assessment & Plan  Primary hypertension   {A/P Summary:[id number]}           No follow-ups on file.           --Cora Daniels, DO

## 2023-11-25 ENCOUNTER — Encounter

## 2023-11-26 MED ORDER — TRANEXAMIC ACID 650 MG PO TABS
650 | ORAL_TABLET | Freq: Three times a day (TID) | ORAL | 2 refills | Status: DC
Start: 2023-11-26 — End: 2024-05-27

## 2023-12-02 ENCOUNTER — Encounter

## 2023-12-02 NOTE — Telephone Encounter (Signed)
 Crystal Wheeler is calling to request a refill on the following medication(s):    Medication Request:  Requested Prescriptions     Pending Prescriptions Disp Refills    losartan (COZAAR) 100 MG tablet 90 tablet 3     Sig: Take 1 tablet by mouth daily       Last Visit Date (If Applicable):  10/22/2023    Next Visit Date:    12/05/2023            '

## 2023-12-03 MED ORDER — LOSARTAN POTASSIUM 100 MG PO TABS
100 | ORAL_TABLET | Freq: Every day | ORAL | 3 refills | Status: AC
Start: 2023-12-03 — End: ?

## 2023-12-05 ENCOUNTER — Encounter: Payer: PRIVATE HEALTH INSURANCE | Attending: Family Medicine | Primary: Family Medicine

## 2023-12-05 DIAGNOSIS — I10 Essential (primary) hypertension: Secondary | ICD-10-CM

## 2023-12-05 NOTE — Progress Notes (Unsigned)
 12/05/2023    Crystal Wheeler (DOB:  March 30, 1991) is a 33 y.o. female, here to follow up on HTN, GERD    She was last seen about 3-4 weeks ago. We increased her losartan to 100 mg due to elevated blood pressure. BP cuff was ordered. She tells me the cuff she received wasn't the right size however.     We started her on omeprazole due to symptoms of GERD. We discussed if no improvement or minimal improvement after a trial we will refer to GI.     Thyroid ultrasound was read as normal. Hemoglobin, iron improved, Vitamin D still low. She tells me that she doesn't feel she hasn't been taking her supplements consistently.       She has a history of HTN, previously on chlorthalidone which made her urinate a lot  History of GERD, now on omeprazole  She has a history of anemia, uterine fibroid, potential adenomyosis, taking ferrous sulfate BID. She tells me that she was offered a transfusion after last hemoglobin was 7, however she declined.   She has a history of hypothyroidism  She has a history of sleep apnea  She has a history of Vitamin D deficiency      Review of Systems  Except as noted in the HPI, a 10 point ROS was negative    Prior to Visit Medications    Medication Sig Taking? Authorizing Provider   losartan (COZAAR) 100 MG tablet Take 1 tablet by mouth daily  Ladarian Bonczek, DO   tranexamic acid (LYSTEDA) 650 MG TABS tablet Take 2 tablets by mouth 3 times daily  Knannlein, Arika, DO   vitamin D (ERGOCALCIFEROL) 1.25 MG (50000 UT) CAPS capsule Take 1 capsule by mouth once a week  Jayveon Convey, DO   amLODIPine (NORVASC) 5 MG tablet Take 1 tablet by mouth daily  Coston Mandato, DO   ferrous sulfate (IRON 325) 325 (65 Fe) MG tablet Take 1 tablet by mouth 2 times daily (before meals)  Atticus Wedin, Belenda Cruise, DO   omeprazole (PRILOSEC) 20 MG delayed release capsule Take 1 capsule by mouth every morning (before breakfast)  Unknown Flannigan, DO   Blood Pressure KIT 1 each by Does not apply route daily as needed  (Daily)  Cora Daniels, DO        No Known Allergies    Past Medical History:   Diagnosis Date    Allergic rhinitis     Anemia     Headache     Hypertension     Hypothyroidism     Inflammatory polyarthritis (HCC) 2019    Obesity     Sleep apnea     Uterine fibroid     Vitamin D deficiency        Past Surgical History:   Procedure Laterality Date    CESAREAN SECTION      TONSILLECTOMY AND ADENOIDECTOMY       Family History   Problem Relation Age of Onset    Anemia Mother     High Blood Pressure Mother     Miscarriages / Stillbirths Mother     Diabetes Father     High Blood Pressure Father     Heart Disease Maternal Aunt     Alcohol Abuse Maternal Uncle     Allergy (Severe) Maternal Cousin     Allergy (Severe) Paternal Cousin            There were no vitals filed for this visit.    Estimated body mass index  is 59.63 kg/m as calculated from the following:    Height as of 10/22/23: 1.575 m (5\' 2" ).    Weight as of 10/29/23: 147.9 kg (326 lb).       Objective   Physical Exam  Vitals reviewed.   Constitutional:       General: She is not in acute distress.     Appearance: She is well-developed.   HENT:      Head: Normocephalic and atraumatic.      Mouth/Throat:      Mouth: Mucous membranes are moist.      Pharynx: Oropharynx is clear.   Eyes:      Conjunctiva/sclera: Conjunctivae normal.   Cardiovascular:      Rate and Rhythm: Normal rate and regular rhythm.      Heart sounds: Normal heart sounds. No murmur heard.  Pulmonary:      Effort: Pulmonary effort is normal.      Breath sounds: Normal breath sounds. No wheezing.   Skin:     General: Skin is warm and dry.   Neurological:      General: No focal deficit present.      Mental Status: She is alert and oriented to person, place, and time.      Motor: No abnormal muscle tone.      Coordination: Coordination normal.   Psychiatric:         Mood and Affect: Mood normal.         Behavior: Behavior normal.         Thought Content: Thought content normal.         Judgment:  Judgment normal.             Latest Ref Rng & Units 10/21/2023     9:35 AM 02/26/2023    11:44 AM   LAB PRIMARY CARE   GLU random 74 - 99 mg/dL 80  94    CHOL 0 - 132 mg/dL 440     TRIG <102 mg/dL 50     HDL >72 mg/dL 59     LDL CALC 0 - 536 mg/dL 74     NA 644 - 034 mmol/L 140  139    K 3.7 - 5.3 mmol/L 4.1  3.9    BUN 6 - 20 mg/dL 15  18    CR 0.6 - 0.9 mg/dL 1.0  0.9    GFR >74 QV/ZDG/3.87F6 76  88    CA 8.6 - 10.4 mg/dL 9.0  8.9    ALT 10 - 35 U/L 13  12    AST 10 - 35 U/L 19  25    TSH 0.27 - 4.20 uIU/mL 2.49  2.80    HGB 11.9 - 15.1 g/dL 43.3  7.0        Lab Results   Component Value Date/Time    CHOL 143 10/21/2023 09:35 AM    TRIG 50 10/21/2023 09:35 AM    HDL 59 10/21/2023 09:35 AM    GLUCOSE 80 10/21/2023 09:35 AM         Assessment & Plan  Primary hypertension   {A/P Summary:[phone]}           No follow-ups on file.           --Cora Daniels, DO

## 2023-12-10 ENCOUNTER — Ambulatory Visit
Admit: 2023-12-10 | Discharge: 2023-12-10 | Payer: PRIVATE HEALTH INSURANCE | Attending: Family Medicine | Primary: Family Medicine

## 2023-12-10 VITALS — BP 126/84 | HR 95 | Ht 62.0 in | Wt 348.0 lb

## 2023-12-10 DIAGNOSIS — I10 Essential (primary) hypertension: Secondary | ICD-10-CM

## 2023-12-10 NOTE — Progress Notes (Signed)
 12/10/2023    Crystal Wheeler (DOB:  Apr 14, 1991) is a 33 y.o. female, here to follow up on HTN, GERD    At our last visit we added amlodipine to her blood pressure regimen. She has been tolerating this just fine. She tells me shortly after starting it she was in to see GYN about heavy bleeding and her pressure was very high there. She tells me she was quite anxious however and she had only just taken her medication.         She has a history of HTN, previously on chlorthalidone which made her urinate a lot  History of GERD, now on omeprazole  She has a history of anemia, uterine fibroid, potential adenomyosis, taking ferrous sulfate BID. She tells me that she was offered a transfusion after last hemoglobin was 7, however she declined.   She has a history of hypothyroidism  She has a history of sleep apnea  She has a history of Vitamin D deficiency      Review of Systems  Except as noted in the HPI, a 10 point ROS was negative    Prior to Visit Medications    Medication Sig Taking? Authorizing Provider   losartan (COZAAR) 100 MG tablet Take 1 tablet by mouth daily  Jacquees Gongora, DO   tranexamic acid (LYSTEDA) 650 MG TABS tablet Take 2 tablets by mouth 3 times daily  Knannlein, Arika, DO   vitamin D (ERGOCALCIFEROL) 1.25 MG (50000 UT) CAPS capsule Take 1 capsule by mouth once a week  Lesha Jager, DO   amLODIPine (NORVASC) 5 MG tablet Take 1 tablet by mouth daily  Safia Panzer, DO   ferrous sulfate (IRON 325) 325 (65 Fe) MG tablet Take 1 tablet by mouth 2 times daily (before meals)  Cairo Agostinelli, DO   omeprazole (PRILOSEC) 20 MG delayed release capsule Take 1 capsule by mouth every morning (before breakfast)  Stephon Weathers, DO   Blood Pressure KIT 1 each by Does not apply route daily as needed (Daily)  Hartwell Linea, DO        No Known Allergies    Past Medical History:   Diagnosis Date    Allergic rhinitis     Anemia     Headache     Hypertension     Hypothyroidism     Inflammatory  polyarthritis (HCC) 2019    Obesity     Sleep apnea     Uterine fibroid     Vitamin D deficiency        Past Surgical History:   Procedure Laterality Date    CESAREAN SECTION      TONSILLECTOMY AND ADENOIDECTOMY       Family History   Problem Relation Age of Onset    Anemia Mother     High Blood Pressure Mother     Miscarriages / Stillbirths Mother     Diabetes Father     High Blood Pressure Father     Heart Disease Maternal Aunt     Alcohol Abuse Maternal Uncle     Allergy (Severe) Maternal Cousin     Allergy (Severe) Paternal Cousin            Vitals:    12/10/23 1127   BP: 126/84   Pulse: 95   SpO2: 97%   Weight: (!) 157.9 kg (348 lb)   Height: 1.575 m (5\' 2" )       Estimated body mass index is 63.65 kg/m as calculated from the following:  Height as of this encounter: 1.575 m (5\' 2" ).    Weight as of this encounter: 157.9 kg (348 lb).       Objective   Physical Exam  Vitals reviewed.   Constitutional:       General: She is not in acute distress.     Appearance: She is well-developed.   HENT:      Head: Normocephalic and atraumatic.      Mouth/Throat:      Mouth: Mucous membranes are moist.      Pharynx: Oropharynx is clear.   Eyes:      Conjunctiva/sclera: Conjunctivae normal.   Cardiovascular:      Rate and Rhythm: Normal rate and regular rhythm.      Heart sounds: Normal heart sounds. No murmur heard.  Pulmonary:      Effort: Pulmonary effort is normal.      Breath sounds: Normal breath sounds. No wheezing.   Skin:     General: Skin is warm and dry.   Neurological:      General: No focal deficit present.      Mental Status: She is alert and oriented to person, place, and time.      Motor: No abnormal muscle tone.      Coordination: Coordination normal.   Psychiatric:         Mood and Affect: Mood normal.         Behavior: Behavior normal.         Thought Content: Thought content normal.         Judgment: Judgment normal.             Latest Ref Rng & Units 10/21/2023     9:35 AM 02/26/2023    11:44 AM   LAB  PRIMARY CARE   GLU random 74 - 99 mg/dL 80  94    CHOL 0 - 098 mg/dL 119     TRIG <147 mg/dL 50     HDL >82 mg/dL 59     LDL CALC 0 - 956 mg/dL 74     NA 213 - 086 mmol/L 140  139    K 3.7 - 5.3 mmol/L 4.1  3.9    BUN 6 - 20 mg/dL 15  18    CR 0.6 - 0.9 mg/dL 1.0  0.9    GFR >57 QI/ONG/2.95M8 76  88    CA 8.6 - 10.4 mg/dL 9.0  8.9    ALT 10 - 35 U/L 13  12    AST 10 - 35 U/L 19  25    TSH 0.27 - 4.20 uIU/mL 2.49  2.80    HGB 11.9 - 15.1 g/dL 41.3  7.0        Lab Results   Component Value Date/Time    CHOL 143 10/21/2023 09:35 AM    TRIG 50 10/21/2023 09:35 AM    HDL 59 10/21/2023 09:35 AM    GLUCOSE 80 10/21/2023 09:35 AM         Assessment & Plan  Primary hypertension   Within goal of <140/90. I do think her pressure was high at the GYN for several reasons as she mentioned and she had only just started the amlodipine. We'll continue her regimen of losartan and amlodipine         Gastroesophageal reflux disease without esophagitis   Stable, continue to monitor         Vitamin D deficiency   Continue supplementation, plan to recheck next  visit         Other iron deficiency anemia    Continue supplementation, plan to recheck next visit         Lymphadenopathy   Plan for repeat US  in August 2025           Return in about 4 months (around 04/10/2024).           --Nishant Schrecengost, DO

## 2023-12-12 NOTE — Assessment & Plan Note (Addendum)
 Within goal of <140/90. I do think her pressure was high at the GYN for several reasons as she mentioned and she had only just started the amlodipine. We'll continue her regimen of losartan and amlodipine

## 2024-05-27 MED ORDER — TRANEXAMIC ACID 650 MG PO TABS
650 | ORAL_TABLET | Freq: Three times a day (TID) | ORAL | 2 refills | Status: AC
Start: 2024-05-27 — End: 2024-06-26

## 2024-05-27 NOTE — Telephone Encounter (Signed)
 Fax refill request for Tranex acid 650 mg, order pended
